# Patient Record
Sex: Female | Born: 1935 | Race: White | Hispanic: No | Smoking: Former smoker
Health system: Southern US, Community
[De-identification: ages and names within clinical notes are randomized; demographics above are authoritative.]

## PROBLEM LIST (undated history)

## (undated) DIAGNOSIS — E785 Hyperlipidemia, unspecified: Secondary | ICD-10-CM

## (undated) DIAGNOSIS — J449 Chronic obstructive pulmonary disease, unspecified: Secondary | ICD-10-CM

## (undated) DIAGNOSIS — E756 Lipid storage disorder, unspecified: Secondary | ICD-10-CM

## (undated) DIAGNOSIS — R42 Dizziness and giddiness: Secondary | ICD-10-CM

## (undated) DIAGNOSIS — E8809 Other disorders of plasma-protein metabolism, not elsewhere classified: Secondary | ICD-10-CM

## (undated) DIAGNOSIS — K219 Gastro-esophageal reflux disease without esophagitis: Secondary | ICD-10-CM

## (undated) DIAGNOSIS — N816 Rectocele: Secondary | ICD-10-CM

## (undated) DIAGNOSIS — I10 Essential (primary) hypertension: Secondary | ICD-10-CM

## (undated) HISTORY — PX: LAPAROSCOPIC TOTAL HYSTERECTOMY: SUR800

## (undated) HISTORY — DX: Lipid storage disorder, unspecified: E75.6

## (undated) HISTORY — DX: Gastro-esophageal reflux disease without esophagitis: K21.9

## (undated) HISTORY — PX: FRACTURE SURGERY: SHX138

## (undated) HISTORY — DX: Hyperlipidemia, unspecified: E78.5

## (undated) HISTORY — DX: Other disorders of plasma-protein metabolism, not elsewhere classified: E88.09

## (undated) HISTORY — PX: DILATION AND CURETTAGE OF UTERUS: SHX78

## (undated) HISTORY — PX: TIBIALIS TENDON TRANSFER / REPAIR: SHX6630

## (undated) HISTORY — PX: MOHS SURGERY: SUR867

## (undated) HISTORY — PX: CATARACT EXTRACTION, BILATERAL: SHX1313

## (undated) HISTORY — DX: Essential (primary) hypertension: I10

## (undated) HISTORY — DX: Rectocele: N81.6

## (undated) HISTORY — DX: Dizziness and giddiness: R42

## (undated) HISTORY — DX: Chronic obstructive pulmonary disease, unspecified: J44.9

---

## 2005-04-25 ENCOUNTER — Ambulatory Visit: Payer: Self-pay | Admitting: Internal Medicine

## 2005-05-09 ENCOUNTER — Encounter (INDEPENDENT_AMBULATORY_CARE_PROVIDER_SITE_OTHER): Payer: Self-pay | Admitting: Specialist

## 2005-05-09 ENCOUNTER — Ambulatory Visit: Payer: Self-pay | Admitting: Internal Medicine

## 2010-03-02 ENCOUNTER — Encounter: Payer: Self-pay | Admitting: Internal Medicine

## 2010-04-14 ENCOUNTER — Encounter (INDEPENDENT_AMBULATORY_CARE_PROVIDER_SITE_OTHER): Payer: Self-pay | Admitting: *Deleted

## 2010-04-15 ENCOUNTER — Ambulatory Visit: Payer: Self-pay | Admitting: Internal Medicine

## 2010-05-09 ENCOUNTER — Ambulatory Visit: Payer: Self-pay | Admitting: Internal Medicine

## 2010-05-11 ENCOUNTER — Encounter: Payer: Self-pay | Admitting: Internal Medicine

## 2010-09-13 NOTE — Letter (Signed)
Summary: [facility] Instructions  [facility]  [address] [place], [place] [address]   Phone: [phone]  Fax: [phone]       Katie Tran    September 29, 1933    MRN: [id number]        Procedure Day /Date:  Monday 05/09/2010     Arrival Time: 7:30 am      Procedure Time: 8:30 am     Location of Procedure:                    _ x_  [facility] (4th Floor)                        PREPARATION FOR COLONOSCOPY WITH MOVIPREP   Starting 5 days prior to your procedure Wednesday 9/21 do not eat nuts, seeds, popcorn, corn, beans, peas,  salads, or any raw vegetables.  Do not take any fiber supplements (e.g. Metamucil, Citrucel, and Benefiber).  THE DAY BEFORE YOUR PROCEDURE         DATE: Sunday 9/25  1.  Drink clear liquids the entire day-NO SOLID FOOD  2.  Do not drink anything colored red or purple.  Avoid juices with pulp.  No orange juice.  3.  Drink at least 64 oz. (8 glasses) of fluid/clear liquids during the day to prevent dehydration and help the prep work efficiently.  CLEAR LIQUIDS INCLUDE: Water Jello Ice Popsicles Tea (sugar ok, no milk/cream) Powdered fruit flavored drinks Coffee (sugar ok, no milk/cream) Gatorade Juice: apple, white grape, white cranberry  Lemonade Clear bullion, consomm, broth Carbonated beverages (any kind) Strained chicken noodle soup Hard Candy                             4.  In the morning, mix first dose of MoviPrep solution:    Empty 1 Pouch A and 1 Pouch B into the disposable container    Add lukewarm drinking water to the top line of the container. Mix to dissolve    Refrigerate (mixed solution should be used within 24 hrs)  5.  Begin drinking the prep at 5:00 p.m. The MoviPrep container is divided by 4 marks.   Every 15 minutes drink the solution down to the next mark (approximately 8 oz) until the full liter is complete.   6.  Follow completed prep with 16 oz of clear liquid of your choice  (Nothing red or purple).  Continue to drink clear liquids until bedtime.  7.  Before going to bed, mix second dose of MoviPrep solution:    Empty 1 Pouch A and 1 Pouch B into the disposable container    Add lukewarm drinking water to the top line of the container. Mix to dissolve    Refrigerate  THE DAY OF YOUR PROCEDURE      DATE: Monday 9/26  Beginning at 3:30 a.m. (5 hours before procedure):         1. Every 15 minutes, drink the solution down to the next mark (approx 8 oz) until the full liter is complete.  2. Follow completed prep with 16 oz. of clear liquid of your choice.    3. You may drink clear liquids until 6:30 am (2 HOURS BEFORE PROCEDURE).   MEDICATION INSTRUCTIONS  Unless otherwise instructed, you should take regular prescription medications with a small sip of water   as early as possible the morning of  your procedure.           OTHER INSTRUCTIONS  You will need a responsible adult at least 75 years of age to accompany you and drive you home.   This person must remain in the waiting room during your procedure.  Wear loose fitting clothing that is easily removed.  Leave jewelry and other valuables at home.  However, you may wish to bring a book to read or  an iPod/MP3 player to listen to music as you wait for your procedure to start.  Remove all body piercing jewelry and leave at home.  Total time from sign-in until discharge is approximately 2-3 hours.  You should go home directly after your procedure and rest.  You can resume normal activities the  day after your procedure.  The day of your procedure you should not:   Drive   Make legal decisions   Operate machinery   Drink alcohol   Return to work  You will receive specific instructions about eating, activities and medications before you leave.    The above instructions have been reviewed and explained to me by  Ezra Sites RN  April 15, 2010 1:01 PM      I fully understand  and can verbalize these instructions _____________________________ Date _________

## 2010-09-13 NOTE — Letter (Signed)
Summary: Patient Notice- Polyp Results  [facility]  [address] [place], [place] [address]   Phone: [phone]  Fax: [phone]        May 11, 2010 MRN: [id number]    Katie Tran [address] [place], [place]  [address]    Dear Ms. Breeding,  I am pleased to inform you that the colon polyp(s) removed during your recent colonoscopy was (were) found to be benign (no cancer detected) upon pathologic examination.  I recommend you have a repeat colonoscopy examination in 5 years to look for recurrent polyps, as having colon polyps increases your risk for having recurrent polyps or even colon cancer in the future.  Should you develop new or worsening symptoms of abdominal pain, bowel habit changes or bleeding from the rectum or bowels, please schedule an evaluation with either your primary care physician or with me.  Additional information/recommendations:  __ No further action with gastroenterology is needed at this time. Please      follow-up with your primary care physician for your other healthcare      needs.    Please call us if you are having persistent problems or have questions about your condition that have not been fully answered at this time.  Sincerely,  Hilarie Fredrickson MD  This letter has been electronically signed by your physician.  Appended Document: Patient Notice- Polyp Results letter mailed

## 2010-09-13 NOTE — Procedures (Signed)
Summary: Colonoscopy  Patient: Katie Tran Note: All result statuses are Final unless otherwise noted.  Tests: (1) Colonoscopy (COL)   COL Colonoscopy           DONE     [facility]     [address]     [place], [place]  [address]           COLONOSCOPY PROCEDURE REPORT           PATIENT:  Katie Tran, Katie Tran  MR#:  [id number]     BIRTHDATE:  [birth year], 76 yrs. old  GENDER:  female     ENDOSCOPIST:  Wilhemina Bonito. Eda Keys, MD     REF. BY:  Surveillance Program Recall     PROCEDURE DATE:  05/09/2010     PROCEDURE:  Colonoscopy with snare polypectomy x 2     ASA CLASS:  Class II     INDICATIONS:  history of pre-cancerous (adenomatous) colon polyps,     surveillance and high-risk screening ; prior exams 1997, 2000,     2006 w/ adenomas     MEDICATIONS:   Fentanyl 100 mcg IV, Versed 10 mg IV           DESCRIPTION OF PROCEDURE:   After the risks benefits and     alternatives of the procedure were thoroughly explained, informed     consent was obtained.  Digital rectal exam was performed and     revealed moderate external hemorrhoids.   The LB [id number]     PEDIATRIC endoscope was introduced through the anus and advanced     to the cecum, which was identified by both the appendix and     ileocecal valve, limited by diverticulosis, severe. Time to cecum     = 2:36 min.    The quality of the prep was excellent, using     MoviPrep.  The instrument was then slowly withdrawn (time = 13:14     min) as the colon was fully examined.     <<PROCEDUREIMAGES>>           FINDINGS:  Two polyps ( , ) were found in the cecum. Polyps     were snared without cautery. Retrieval was successful.   Severe     diverticulosis, WITH SIGMOID STENOSIS, was found throughout the     colon.   Retroflexed views in the rectum revealed internal     hemorrhoids.    The scope was then withdrawn from the patient and     the procedure completed.           COMPLICATIONS:  None     ENDOSCOPIC  IMPRESSION:     1) Two polyps in the cecum - Removed     2) Severe diverticulosis (with sigmoid STENOSIS) throughout the     colon     3) Internal hemorrhoids           RECOMMENDATIONS:     1) Follow up colonoscopy in 5 years           ______________________________     Wilhemina Bonito. Eda Keys, MD           CC:  The Patient,  Suzy Bouchard MD           n.     eSIGNED:   Wilhemina Bonito. Eda Keys at 05/09/2010 09:18 AM           Eustace Pen, [id number]  Note: An exclamation mark (!) indicates a result  that was not dispersed into the flowsheet. Document Creation Date: 05/09/2010 9:19 AM _______________________________________________________________________  (1) Order result status: Final Collection or observation date-time: 05/09/2010 09:08 Requested date-time:  Receipt date-time:  Reported date-time:  Referring Physician:   Ordering Physician: Fransico Setters [phone]) Specimen Source:  Source: Launa Grill Order Number: [id number] Lab site:   Appended Document: Colonoscopy     Procedures Next Due Date:    Colonoscopy: 04/2015

## 2010-09-13 NOTE — Letter (Signed)
Summary: Previsit letter  [facility]  [address] [place], [place] [address]   Phone: [phone]  Fax: [phone]       03/02/2010 MRN: [id number]  Katie Tran [address] [place], [place]  [address]  Dear Ms. [name],  Welcome to the Gastroenterology Division at [facility].    You are scheduled to see a nurse for your pre-procedure visit on 04-15-10 at 1pm on the 3rd floor at [facility], [address]  We ask that you try to arrive at our office 15 minutes prior to your appointment time to allow for check-in.  Your nurse visit will consist of discussing your medical and surgical history, your immediate family medical history, and your medications.    Please bring a complete list of all your medications or, if you prefer, bring the medication bottles and we will list them.  We will need to be aware of both prescribed and over the counter drugs.  We will need to know exact dosage information as well.  If you are on blood thinners (Coumadin, Plavix, Aggrenox, Ticlid, etc.) please call our office today/prior to your appointment, as we need to consult with your physician about holding your medication.   Please be prepared to read and sign documents such as consent forms, a financial agreement, and acknowledgement forms.  If necessary, and with your consent, a friend or relative is welcome to sit-in on the nurse visit with you.  Please bring your insurance card so that we may make a copy of it.  If your insurance requires a referral to see a specialist, please bring your referral form from your primary care physician.  No co-pay is required for this nurse visit.     If you cannot keep your appointment, please call [phone] to cancel or reschedule prior to your appointment date.  This allows [place] the opportunity to schedule an appointment for another patient in need of care.    Thank you for choosing [facility] for your medical needs.   We appreciate the opportunity to care for you.  Please visit [place] at our website  to learn more about our practice.                     Sincerely.                                                                                                                   The Gastroenterology Division

## 2010-09-13 NOTE — Miscellaneous (Signed)
Summary: [facility]  Clinical Lists Changes  Medications: Added new medication of MOVIPREP 100 GM  SOLR (PEG-KCL-NACL-NASULF-NA ASC-C) As per prep instructions. - Signed Rx of MOVIPREP 100 GM  SOLR (PEG-KCL-NACL-NASULF-NA ASC-C) As per prep instructions.;  #1 x 0;  Signed;  Entered by: Ezra Sites RN;  Authorized by: Hilarie Fredrickson MD;  Method used: Electronically to [facility] [address] [place], [place]  [address], Ph: [phone], Fax: [phone] Allergies: Added new allergy or adverse reaction of PCN Observations: Added new observation of NKA: F (04/15/2010 12:38)    Prescriptions: MOVIPREP 100 GM  SOLR (PEG-KCL-NACL-NASULF-NA ASC-C) As per prep instructions.  #1 x 0   Entered by:   Ezra Sites RN   Authorized by:   Hilarie Fredrickson MD   Signed by:   Ezra Sites RN on 04/15/2010   Method used:   Electronically to        [facility] (retail)       [address]       [place], [place]  [address]       Ph: [phone]       Fax: [phone]   RxID:   [id number]

## 2011-05-15 ENCOUNTER — Other Ambulatory Visit: Payer: Self-pay | Admitting: Dermatology

## 2011-07-05 ENCOUNTER — Other Ambulatory Visit: Payer: Self-pay | Admitting: Dermatology

## 2015-02-04 ENCOUNTER — Encounter: Payer: Self-pay | Admitting: Internal Medicine

## 2015-02-12 ENCOUNTER — Encounter: Payer: Self-pay | Admitting: Internal Medicine

## 2015-04-12 ENCOUNTER — Ambulatory Visit (AMBULATORY_SURGERY_CENTER): Payer: Self-pay

## 2015-04-12 VITALS — Ht 64.5 in | Wt 190.0 lb

## 2015-04-12 DIAGNOSIS — Z860100 Personal history of colon polyps, unspecified: Secondary | ICD-10-CM

## 2015-04-12 MED ORDER — MOVIPREP 100 G PO SOLR: 1.0000 | Freq: Once | ORAL | Status: DC

## 2015-04-12 NOTE — Progress Notes (Signed)
No allergies to eggs or soy No home oxygen No diet/weight loss meds No past problems with anesthesia  Has email  Emmi instruction given for colonoscopy

## 2015-04-26 ENCOUNTER — Ambulatory Visit (AMBULATORY_SURGERY_CENTER): Payer: Medicare Other | Admitting: Internal Medicine

## 2015-04-26 ENCOUNTER — Encounter: Payer: Self-pay | Admitting: Internal Medicine

## 2015-04-26 VITALS — BP 148/81 | HR 74 | Temp 97.4°F | Resp 18 | Ht 64.5 in | Wt 190.0 lb

## 2015-04-26 DIAGNOSIS — D123 Benign neoplasm of transverse colon: Secondary | ICD-10-CM

## 2015-04-26 DIAGNOSIS — D122 Benign neoplasm of ascending colon: Secondary | ICD-10-CM

## 2015-04-26 DIAGNOSIS — Z86010 Personal history of colonic polyps: Secondary | ICD-10-CM

## 2015-04-26 MED ORDER — SODIUM CHLORIDE 0.9 % IV SOLN: 500.0000 mL | INTRAVENOUS | Status: DC

## 2015-04-26 NOTE — Patient Instructions (Signed)
Impressions/recommendations:  Polyps (handout given) Diverticulosis (handout given) High Fiber diet (handout given)  YOU HAD AN ENDOSCOPIC PROCEDURE TODAY AT THE [facility]:   Refer to the procedure report that was given to you for any specific questions about what was found during the examination.  If the procedure report does not answer your questions, please call your gastroenterologist to clarify.  If you requested that your care partner not be given the details of your procedure findings, then the procedure report has been included in a sealed envelope for you to review at your convenience later.  YOU SHOULD EXPECT: Some feelings of bloating in the abdomen. Passage of more gas than usual.  Walking can help get rid of the air that was put into your GI tract during the procedure and reduce the bloating. If you had a lower endoscopy (such as a colonoscopy or flexible sigmoidoscopy) you may notice spotting of blood in your stool or on the toilet paper. If you underwent a bowel prep for your procedure, you may not have a normal bowel movement for a few days.  Please Note:  You might notice some irritation and congestion in your nose or some drainage.  This is from the oxygen used during your procedure.  There is no need for concern and it should clear up in a day or so.  SYMPTOMS TO REPORT IMMEDIATELY:   Following lower endoscopy (colonoscopy or flexible sigmoidoscopy):  Excessive amounts of blood in the stool  Significant tenderness or worsening of abdominal pains  Swelling of the abdomen that is new, acute  Fever of 100F or higher  For urgent or emergent issues, a gastroenterologist can be reached at any hour by calling [phone].   DIET: Your first meal following the procedure should be a small meal and then it is ok to progress to your normal diet. Heavy or fried foods are harder to digest and may make you feel nauseous or bloated.  Likewise, meals heavy in dairy and  vegetables can increase bloating.  Drink plenty of fluids but you should avoid alcoholic beverages for 24 hours.  ACTIVITY:  You should plan to take it easy for the rest of today and you should NOT DRIVE or use heavy machinery until tomorrow (because of the sedation medicines used during the test).    FOLLOW UP: Our staff will call the number listed on your records the next business day following your procedure to check on you and address any questions or concerns that you may have regarding the information given to you following your procedure. If we do not reach you, we will leave a message.  However, if you are feeling well and you are not experiencing any problems, there is no need to return our call.  We will assume that you have returned to your regular daily activities without incident.  If any biopsies were taken you will be contacted by phone or by letter within the next 1-3 weeks.  Please call us at [phone] if you have not heard about the biopsies in 3 weeks.    SIGNATURES/CONFIDENTIALITY: You and/or your care partner have signed paperwork which will be entered into your electronic medical record.  These signatures attest to the fact that that the information above on your After Visit Summary has been reviewed and is understood.  Full responsibility of the confidentiality of this discharge information lies with you and/or your care-partner. 

## 2015-04-26 NOTE — Op Note (Signed)
[facility]  [address] [place], [address]   COLONOSCOPY PROCEDURE REPORT  PATIENT: Lonita, Debes  MR#: [id number] BIRTHDATE: Nov 20, 1933 , 81  yrs. old GENDER: female ENDOSCOPIST: Eustace Quail, MD REFERRED [id number] Program Recall PROCEDURE DATE:  04/26/2015 PROCEDURE:   Colonoscopy, surveillance and Colonoscopy with snare polypectomy x 3 First Screening Colonoscopy - Avg.  risk and is 50 yrs.  old or older - No.  Prior Negative Screening - Now for repeat screening. N/A  History of Adenoma - Now for follow-up colonoscopy & has been > or = to 3 yrs.  Yes hx of adenoma.  Has been 3 or more years since last colonoscopy.  Polyps removed today? Yes ASA CLASS:   Class II INDICATIONS:Surveillance due to prior colonic neoplasia and PH Colon Adenoma. . Previous examinations 1997, 2000, 2006, 2011 with tubular adenomas MEDICATIONS: Monitored anesthesia care and Propofol 230 mg IV  DESCRIPTION OF PROCEDURE:   After the risks benefits and alternatives of the procedure were thoroughly explained, informed consent was obtained.  The digital rectal exam revealed no abnormalities of the rectum.   The LB [id number]  endoscope was introduced through the anus and advanced to the cecum, which was identified by both the appendix and ileocecal valve. No adverse events experienced.   The quality of the prep was excellent. (MoviPrep was used)  The instrument was then slowly withdrawn as the colon was fully examined. Estimated blood loss is zero unless otherwise noted in this procedure report.  COLON FINDINGS: Three polyps measuring 2 mm in size were found in the transverse colon and ascending colon.  A polypectomy was performed with a cold snare.  The resection was complete, the polyp tissue was completely retrieved and sent to histology.   There was severe diverticulosis noted throughout the entire examined colon. The examination was otherwise normal.   Retroflexed views revealed internal hemorrhoids. The time to cecum = 6.5 Withdrawal time = 11.5   The scope was withdrawn and the procedure completed. COMPLICATIONS: There were no immediate complications.  ENDOSCOPIC IMPRESSION: 1.   Three polyps were found in the transverse colon and ascending colon; polypectomy was performed with a cold snare 2.   Severe diverticulosis was noted throughout the entire examined colon 3.   The examination was otherwise normal  RECOMMENDATIONS: 1. Return to the care of your primary provider.  GI follow up as needed  eSigned:  Eustace Quail, MD 04/26/2015 10:10 AM   cc: The Patient and Darlina Sicilian MD

## 2015-04-26 NOTE — Progress Notes (Signed)
Called to room to assist during endoscopic procedure.  Patient ID and intended procedure confirmed with present staff. Received instructions for my participation in the procedure from the performing physician.  

## 2015-04-26 NOTE — Progress Notes (Signed)
To recovery, report to Ennis, RN, VSS 

## 2015-04-27 ENCOUNTER — Telehealth: Payer: Self-pay | Admitting: *Deleted

## 2015-04-27 NOTE — Telephone Encounter (Signed)
  Follow up Call-  Call back number 04/26/2015  Post procedure Call Back phone  # [phone]  Permission to leave phone message Yes     Patient questions:  Do you have a fever, pain , or abdominal swelling? No. Pain Score  0 *  Have you tolerated food without any problems? Yes.    Have you been able to return to your normal activities? Yes.    Do you have any questions about your discharge instructions: Diet   No. Medications  No. Follow up visit  No.  Do you have questions or concerns about your Care? No.  Actions: * If pain score is 4 or above: No action needed, pain <4.

## 2015-05-04 ENCOUNTER — Encounter: Payer: Self-pay | Admitting: Internal Medicine

## 2016-06-26 DIAGNOSIS — R911 Solitary pulmonary nodule: Secondary | ICD-10-CM | POA: Insufficient documentation

## 2016-11-22 DIAGNOSIS — Z87442 Personal history of urinary calculi: Secondary | ICD-10-CM | POA: Insufficient documentation

## 2016-11-22 DIAGNOSIS — N23 Unspecified renal colic: Secondary | ICD-10-CM | POA: Insufficient documentation

## 2016-12-21 DIAGNOSIS — N8500 Endometrial hyperplasia, unspecified: Secondary | ICD-10-CM | POA: Insufficient documentation

## 2017-01-22 DIAGNOSIS — N3941 Urge incontinence: Secondary | ICD-10-CM | POA: Insufficient documentation

## 2020-10-06 ENCOUNTER — Encounter: Payer: Self-pay | Admitting: Gastroenterology

## 2020-10-06 ENCOUNTER — Telehealth: Payer: Self-pay | Admitting: Gastroenterology

## 2020-10-06 ENCOUNTER — Ambulatory Visit (INDEPENDENT_AMBULATORY_CARE_PROVIDER_SITE_OTHER): Payer: Medicare Other | Admitting: Gastroenterology

## 2020-10-06 ENCOUNTER — Other Ambulatory Visit: Payer: Self-pay

## 2020-10-06 VITALS — BP 138/90 | HR 79 | Ht 64.5 in | Wt 170.0 lb

## 2020-10-06 DIAGNOSIS — K5909 Other constipation: Secondary | ICD-10-CM

## 2020-10-06 DIAGNOSIS — R933 Abnormal findings on diagnostic imaging of other parts of digestive tract: Secondary | ICD-10-CM | POA: Insufficient documentation

## 2020-10-06 NOTE — Telephone Encounter (Signed)
Katie Tran from [facility] office states they do not have the prep kit for a barium enema, this needs to be ordered at a pharmacy.

## 2020-10-06 NOTE — Progress Notes (Signed)
10/06/2020 Katie Tran [id number] Jan 04, 1934   HISTORY OF PRESENT ILLNESS:  This is an 87 year old female who is a patient of Dr. Blanch Media.  She had a colonoscopy in 04/2015 that showed severe diverticulosis and a few polyps that were removed and were tubular adenomas on pathology.  No repeat colonoscopy was recommended due to age. She is here today for follow-up regarding an abnormal CT scan. She tells me that she was having some issues with UTIs and followed up with her urologist or [facility]. She ended up being treated with a 5-day course of Cipro and then some Macrobid. During all that she ended up having a CT scan of the abdomen and pelvis without contrast through [facility].  It showed diverticulosis and mild circumferential thickening of the sigmoid colon with vasa recta prominence is favored to represent sequelae of chronic diverticulitis.  There was also questionable subtle stranding adjacent to the sigmoid diverticulum which may be artifactual in nature versus mild acute diverticulitis. She was told to follow-up with her gastroenterologist regarding these findings. She is not currently having any GI issues. She does report intermittent periumbilical abdominal pain that is associated with a knot or lump. CT scan did report umbilical hernia and ventral hernia.   Past Medical History:  Diagnosis Date  . 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 COPD (chronic obstructive pulmonary disease) ([place])   . GERD (gastroesophageal reflux disease)   . Hyperlipidemia   . Rectocele   . Vertigo    Past Surgical History:  Procedure Laterality Date  . CATARACT EXTRACTION, BILATERAL    . DILATION AND CURETTAGE OF UTERUS    . FRACTURE SURGERY     ankle  . LAPAROSCOPIC TOTAL HYSTERECTOMY    . MOHS SURGERY     x2  . TIBIALIS TENDON TRANSFER / REPAIR     posterior    reports that she has quit smoking. She has never used smokeless tobacco. She reports current  alcohol use of about 2.0 standard drinks of alcohol per week. She reports that she does not use drugs. family history includes Colon polyps in her mother. Allergies  Allergen Reactions  . Penicillins     REACTION: rash  . Epinephrine Palpitations    tachycardia      Outpatient Encounter Medications as of 10/06/2020  Medication Sig  . aspirin 81 MG tablet Take 81 mg by mouth daily.  Marland Kitchen atorvastatin (LIPITOR) 10 MG tablet Take 10 mg by mouth daily.  . Ergocalciferol (VITAMIN D2) 10 MCG (400 UNIT) TABS Take 1 tablet by mouth daily.  Marland Kitchen esomeprazole (NEXIUM) 40 MG capsule Take 40 mg by mouth daily at 12 noon.  . meclizine (ANTIVERT) 25 MG tablet Take 25 mg by mouth 3 (three) times daily as needed for dizziness.  . Multiple Vitamin (MULTIVITAMIN ADULT PO) Take 1 tablet by mouth daily.  Marland Kitchen saccharomyces boulardii (FLORASTOR) 250 MG capsule Take 250 mg by mouth 2 (two) times daily.  . Zinc 30 MG CAPS Take 1 capsule by mouth daily.   No facility-administered encounter medications on file as of 10/06/2020.     REVIEW OF SYSTEMS  : All other systems reviewed and negative except where noted in the History of Present Illness.   PHYSICAL EXAM: BP 138/90   Pulse 79   Ht 5' 4.5" (1.638 m)   Wt 170 lb (77.1 kg)   BMI 28.73 kg/m  General: Well developed white female  in no acute distress Head: Normocephalic and atraumatic Eyes:  Sclerae anicteric, conjunctiva pink. Ears: Normal auditory acuity Lungs: Clear throughout to auscultation; no W/R/R. Heart: Regular rate and rhythm; no M/R/G. Abdomen: Soft, non-distended.  BS present.  Non-tender.  No hernias appreciated on today's exam. Musculoskeletal: Symmetrical with no gross deformities  Skin: No lesions on visible extremities Extremities: No edema  Neurological: Alert oriented x 4, grossly non-focal Psychological:  Alert and cooperative. Normal mood and affect  ASSESSMENT AND PLAN: *Abnormal imaging of the colon on CT scan without contrast  earlier this month.  It showed diverticulosis and mild circumferential thickening of the sigmoid colon with vasa recta prominence is favored to represent sequelae of chronic diverticulitis.  There was also questionable subtle stranding adjacent to the sigmoid diverticulum which may be artifactual in nature versus mild acute diverticulitis.  She does not have any symptoms at this time consistent with diverticulitis and is not tender on exam.  She would like some type of evaluation/follow-up of these findings.  Her last colonoscopy in September 2016 showed severe diverticulosis and 3 adenomatous polyps removed.  Due to her age would not proceed with follow-up colonoscopy.  We will start with barium enema. *Constipation:  Will begin Miralax daily.   CC:  [name], MD

## 2020-10-06 NOTE — Progress Notes (Signed)
Assessment and plan reviewed.  Agree with contrast study as the next step

## 2020-10-06 NOTE — Telephone Encounter (Signed)
Instructions sent via mychart and email.   Barium Enema Prep  Please purchase the following items from the laxative section of your drug store 10 oz.  Magnesium Citrate Bottle 3 Bisacodyl Tablets-Enteric coated 1 Bisacodyl Suppository   Day before exam  12:00 Lunch. This meal may include clear broth, white chicken meat sandwich (no butter, lettuce or other additives), or two hard boiled eggs, strained fruit juices, jello or gelatin ( not containing fruits or nuts).  Coffee or tea (without milk or cream), carbonated beverages.  1:00 pm Drink one full glass or more of water   3:00 pm Drink one full glass or more of water  5:00 pm  Supper.  It is very important that supper be limited to the items described here Broth, Clear jello/gelatin with no whole foods added (no red), soft drinks, gatorade, water black coffee or tea (no milk or cream), popsicles.   No red jello or popsicles.   7:00 pm drink one full glass of water  8:00 pm Drink the bottle of Magnesium Citrate (drink cold if preferred)  10:00 pm  Take three (3) Bisacodyl tablets with one full glass or more of water.   Day of exam  NO breakfast, you may have coffee, tea (without milk or cream), or strained fruit juice 7:00 am unwrap the foil wrapper from bisacodyl suppository and discard the foil.  While lying on your side with thigh elevated, insert the suppository into the rectum and gently push in as far as possible.  Retain the suppository for at least 15 minutes, if possible, before evacuating, even if the urge is strong. Bowel evacuation usually occurs within 15 to 60 minutes.  Patients requiring assistance should have a bed pan, commode or help readily available.

## 2020-10-06 NOTE — Patient Instructions (Addendum)
You have been scheduled for a Barium Enema at [facility] 1st floor  Radiology. Your appointment is scheduled for 10/14/20 at 9:30am. Please arrive 15 minutes prior to your appointment time for registration.  Please make certain to go to [facility]  Radiology after leaving our office today as you will need to pick up and go over a special prep for the test.  Should you have specific questions about the prep or test or if you should need to reschedule or cancel your test, please call radiology scheduling directly at [phone].  START: Miralax- dissolve 17gram( 1 capful ) in at least 8 ounces of water/juice daily.   If you are age 7 or older, your body mass index should be between 23-30. Your Body mass index is 28.73 kg/m. If this is out of the aforementioned range listed, please consider follow up with your Primary Care Provider.  Thank you for choosing me and [facility].  Alonza Bogus PA-C

## 2020-10-08 ENCOUNTER — Telehealth: Payer: Self-pay | Admitting: Gastroenterology

## 2020-10-08 NOTE — Telephone Encounter (Signed)
Instructions sent to patient via my chart and email through the scanner from fax. Called and left detailed message for pt.

## 2020-10-14 ENCOUNTER — Ambulatory Visit (HOSPITAL_COMMUNITY)
Admission: RE | Admit: 2020-10-14 | Discharge: 2020-10-14 | Disposition: A | Discharge status: Home / Self Care | Payer: Medicare Other | Source: Ambulatory Visit | Attending: Gastroenterology | Admitting: Gastroenterology

## 2020-10-14 ENCOUNTER — Other Ambulatory Visit: Payer: Self-pay | Admitting: Gastroenterology

## 2020-10-14 ENCOUNTER — Other Ambulatory Visit: Payer: Self-pay

## 2020-10-14 DIAGNOSIS — R933 Abnormal findings on diagnostic imaging of other parts of digestive tract: Secondary | ICD-10-CM | POA: Diagnosis not present

## 2020-11-02 ENCOUNTER — Ambulatory Visit: Payer: Medicare Other | Admitting: Internal Medicine

## 2021-09-26 DIAGNOSIS — K219 Gastro-esophageal reflux disease without esophagitis: Secondary | ICD-10-CM | POA: Insufficient documentation

## 2021-09-26 DIAGNOSIS — M1990 Unspecified osteoarthritis, unspecified site: Secondary | ICD-10-CM | POA: Insufficient documentation

## 2021-09-26 DIAGNOSIS — E785 Hyperlipidemia, unspecified: Secondary | ICD-10-CM | POA: Insufficient documentation

## 2021-09-26 DIAGNOSIS — E559 Vitamin D deficiency, unspecified: Secondary | ICD-10-CM | POA: Insufficient documentation

## 2022-08-22 IMAGING — DX DG BE SINGLE CONTRAST
13 of 17 series · 17 of 24 positions shown · non-contrast
Comparison: None.

CLINICAL DATA: Diverticulosis. Prior colon polyps. Abdominal pain.

EXAM:
SINGLE CONTRAST BARIUM ENEMA
TECHNIQUE: Initial scout AP supine abdominal image obtained to insure adequate
colon cleansing. Barium was introduced into the colon in a
retrograde fashion and refluxed from the rectum to the cecum. Spot
images of the colon followed by overhead radiographs were obtained.
FLUOROSCOPY TIME:  Fluoroscopy Time:  5 minutes, 18 seconds
Radiation Exposure Index (if provided by the fluoroscopic device):
172 mGy
Number of Acquired Spot Images: N/A

[t abdomen supine]
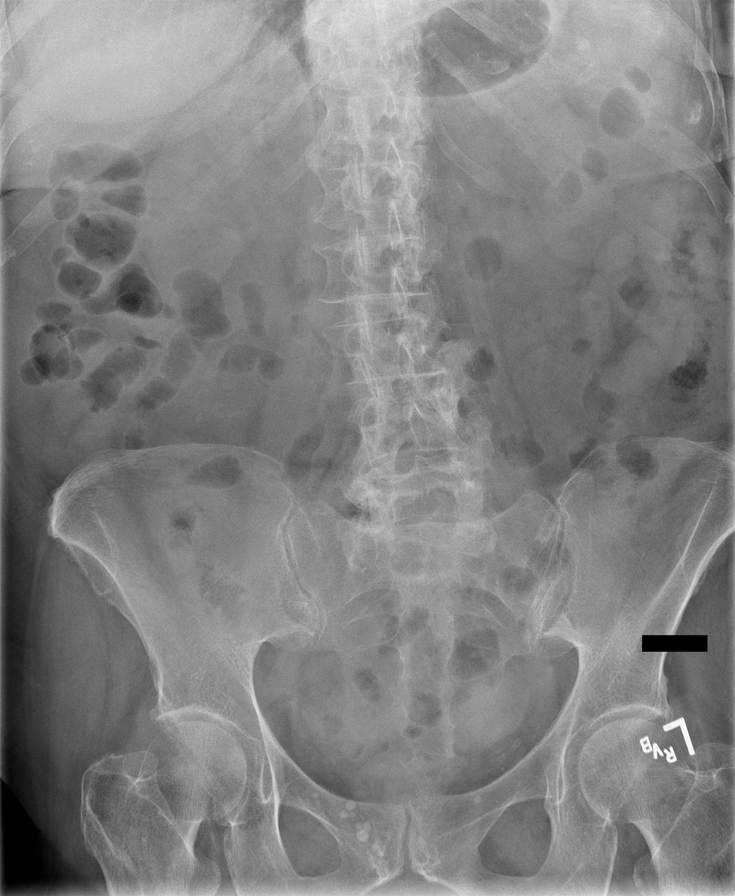

[cp_standard (1 of 10) · tomo slice 8/9.0]
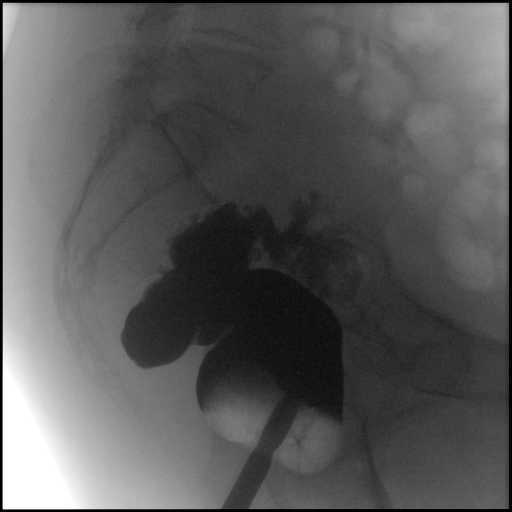

[cp_standard (2 of 10) · tomo slice 7/11.0]
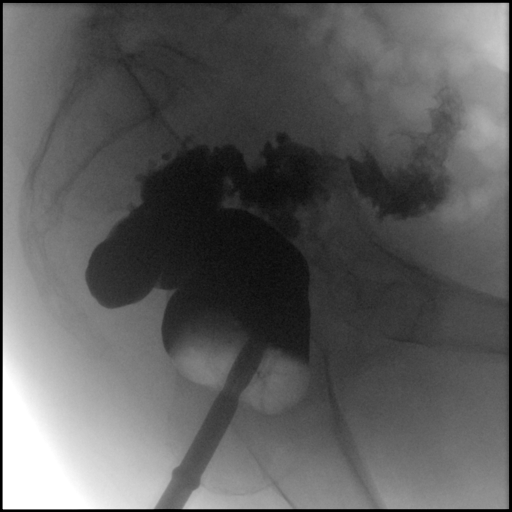

[fluoro_barium singleshot_bw]
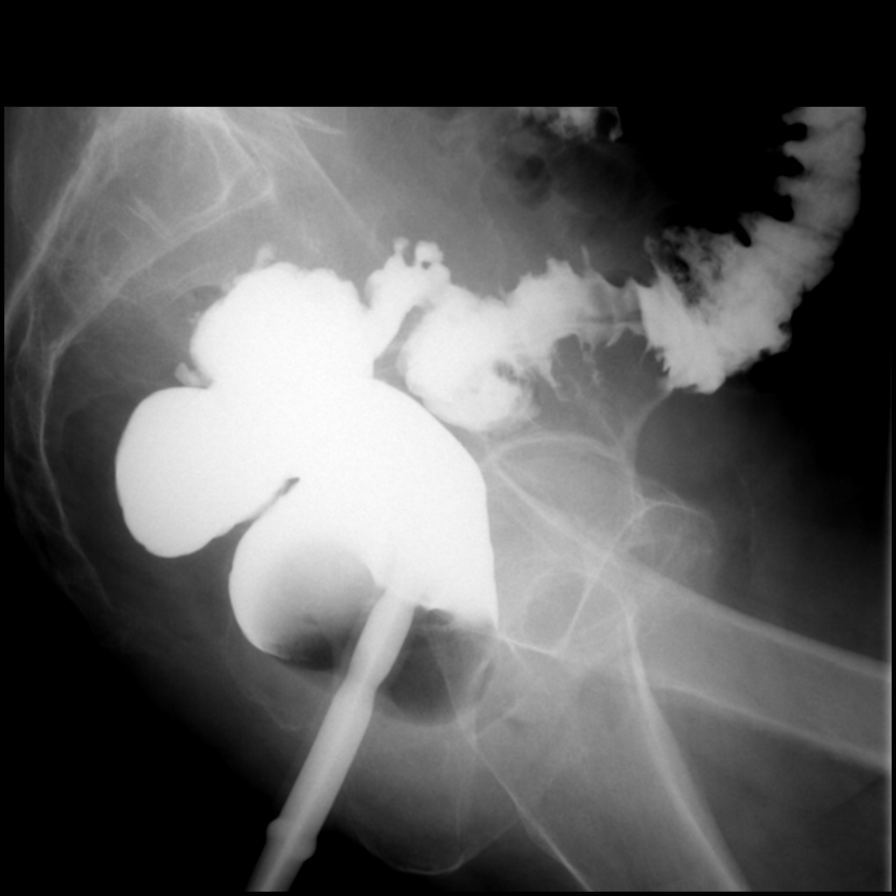

[Series 8: cp_standard · 0.35mm/px · 2 of 9 frames shown (3 of 10)]
[frame 2/9]
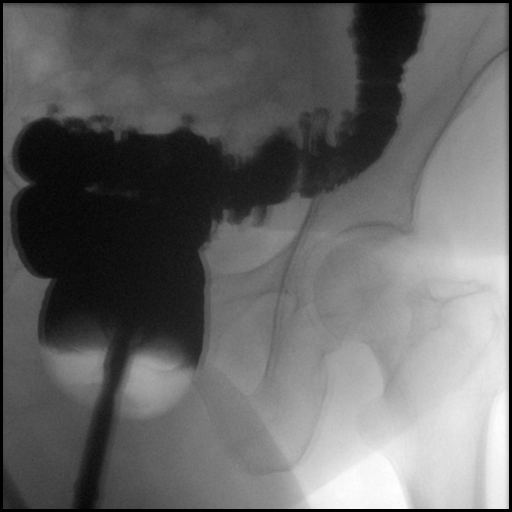
[frame 5/9]
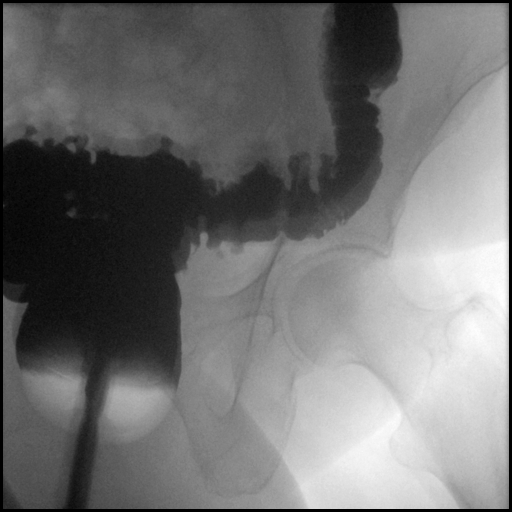

[cp_standard (4 of 10) · tomo slice 22/25.0]
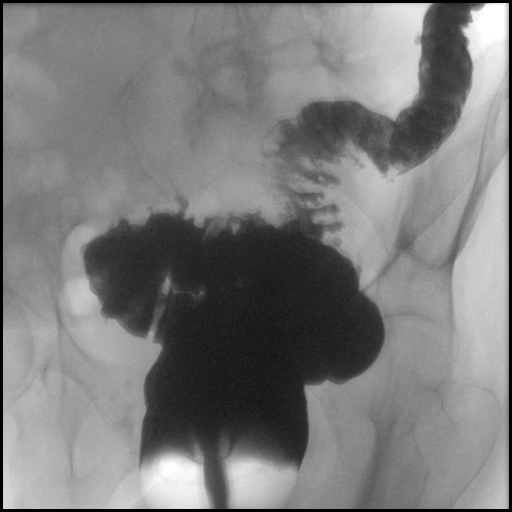

[cp_standard (5 of 10) · tomo slice 9/54.0]
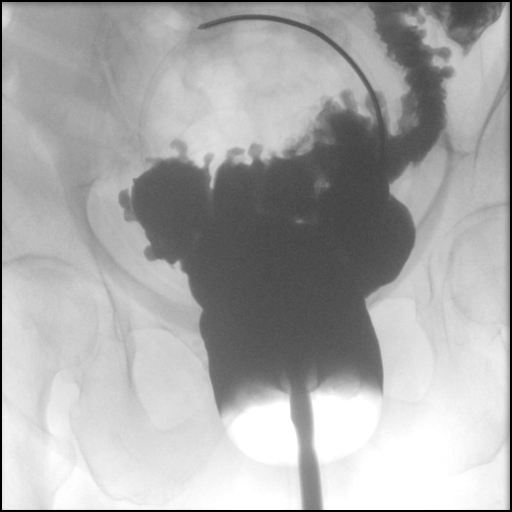

[cp_standard (6 of 10) · tomo slice 39/76.0]
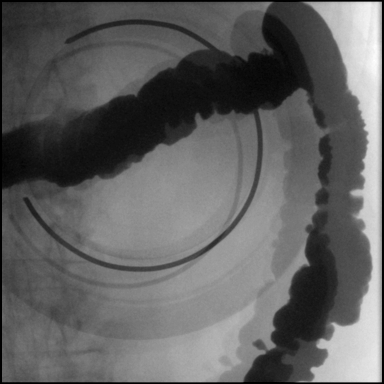

[Series 14: cp_standard · 0.35mm/px · 2 of 74 frames shown (7 of 10)]
[frame 12/74]
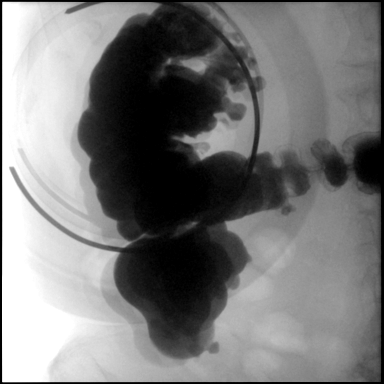
[frame 69/74]
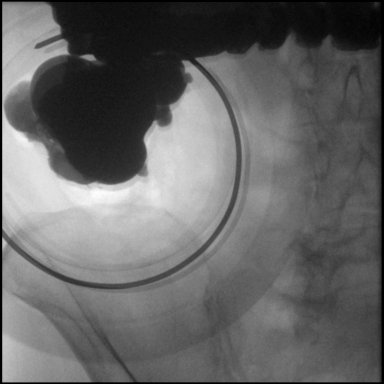

[Series 16: cp_standard · 0.36mm/px · 2 of 30 frames shown (8 of 10)]
[frame 5/30]
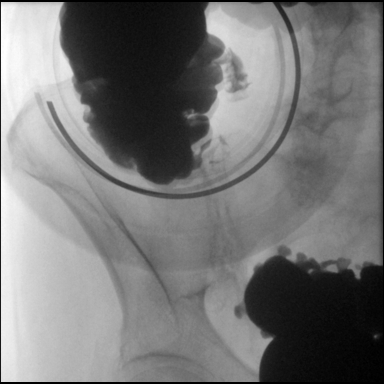
[frame 16/30]
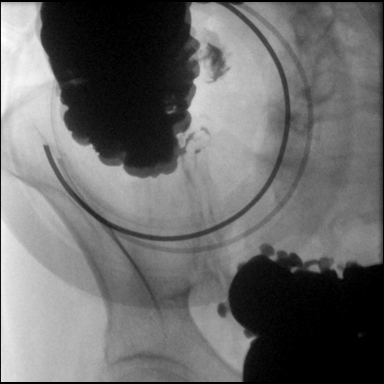

[Series 18: cp_standard · 0.35mm/px · 2 of 37 frames shown (9 of 10)]
[frame 19/37]
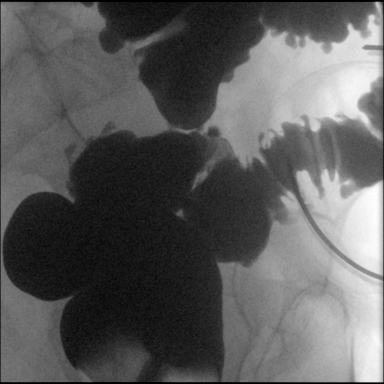
[frame 32/37]
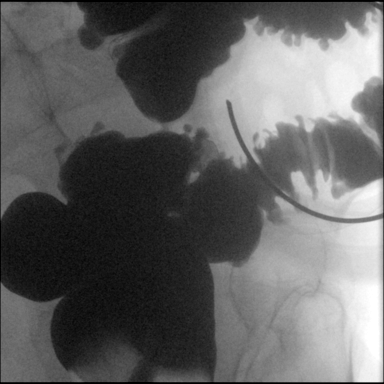

[cp_standard (10 of 10) · tomo slice 39/45.0]
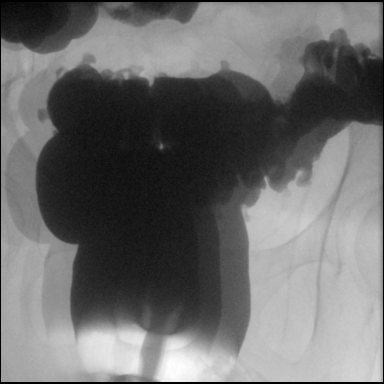

[t abdomen barium]
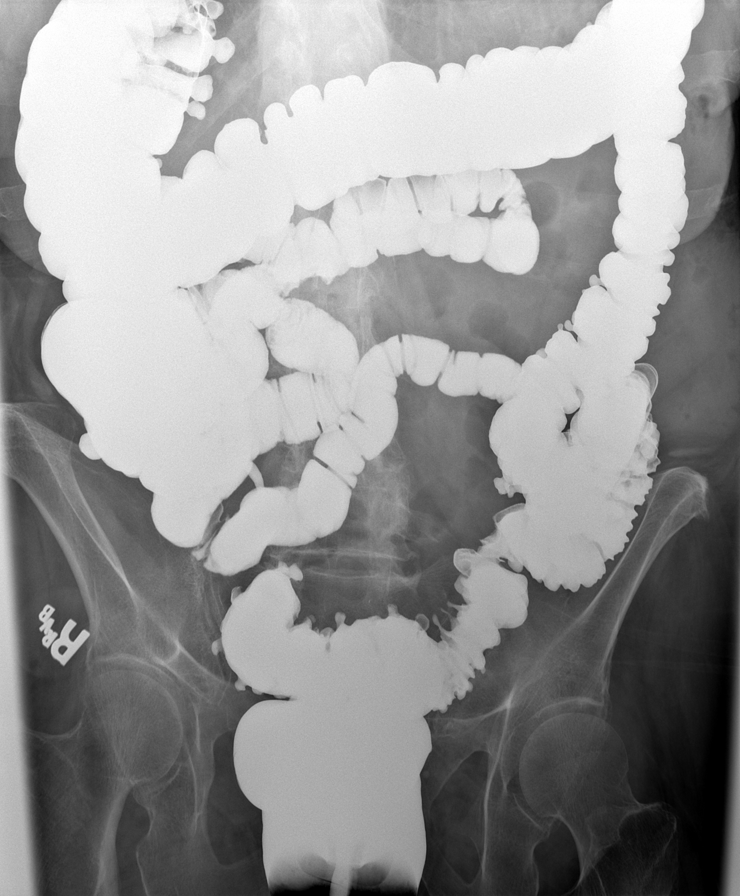

[17 of 24 positions shown; findings below may reference images not displayed]

FINDINGS: In discussion with the patient, she does have some incontinence, and
age 86 I was skeptical that she would be able to hold the pressure
of the double contrast enema. Accordingly, single contrast enema was
performed.

Initial KUB demonstrates normal bowel gas pattern and moderate lower
lumbar spondylosis.

A series of spot images and fluoroscopic series in addition to
overhead views of the colon were obtained during and after low
gravity barium infusion.

There is extensive sigmoid colon diverticulosis resulting in some
mild narrowing in the sigmoid colon likely attributable to the
associated muscular hypertrophy. There is some early spasm in the
sigmoid colon which improved later in the examination. I had the
patient shift into different obliquities in order to show the
slightly redundant pelvic loops of sigmoid colon as best as
possible.

Compression fluoroscopy was utilized and demonstrates some gas
bubbles and minimal residual amount of stool, but no substantial
nonmobile filling defect was identified to suggest mass or larger (1
cm or greater) polyp. We did reflux the terminal ileum and fill the
appendix.

There is scattered diverticula in the descending colon and scant
diverticula elsewhere in the colon, for example clustered along the
hepatic flexure. The refluxed loops of terminal ileum appear normal.

Mild lobularity along the lower rectum after removal of the catheter
probably attributable to internal hemorrhoids of the lower rectum.
IMPRESSION: 1. Colonic diverticulosis most striking in the sigmoid colon region.
2. No mass or discrete polyp is identified; single contrast barium
enema has a sensitivity of about 94% for polyps 10 mm or larger.

## 2022-10-31 ENCOUNTER — Encounter: Payer: Self-pay | Admitting: Obstetrics and Gynecology

## 2022-10-31 ENCOUNTER — Ambulatory Visit (INDEPENDENT_AMBULATORY_CARE_PROVIDER_SITE_OTHER): Payer: Medicare Other | Admitting: Obstetrics and Gynecology

## 2022-10-31 VITALS — BP 193/82 | HR 82 | Ht 64.45 in | Wt 170.0 lb

## 2022-10-31 DIAGNOSIS — N3281 Overactive bladder: Secondary | ICD-10-CM

## 2022-10-31 DIAGNOSIS — R350 Frequency of micturition: Secondary | ICD-10-CM

## 2022-10-31 DIAGNOSIS — N8110 Cystocele, unspecified: Secondary | ICD-10-CM

## 2022-10-31 DIAGNOSIS — N816 Rectocele: Secondary | ICD-10-CM

## 2022-10-31 DIAGNOSIS — N993 Prolapse of vaginal vault after hysterectomy: Secondary | ICD-10-CM

## 2022-10-31 DIAGNOSIS — N393 Stress incontinence (female) (male): Secondary | ICD-10-CM | POA: Diagnosis not present

## 2022-10-31 LAB — POCT URINALYSIS DIPSTICK
Bilirubin, UA: NEGATIVE
Blood, UA: NEGATIVE
Glucose, UA: NEGATIVE
Ketones, UA: NEGATIVE
Leukocytes, UA: NEGATIVE
Nitrite, UA: NEGATIVE
Protein, UA: NEGATIVE
Spec Grav, UA: 1.02 (ref 1.010–1.025)
Urobilinogen, UA: 0.2 E.U./dL
pH, UA: 7 (ref 5.0–8.0)

## 2022-10-31 NOTE — Progress Notes (Signed)
[name] New Patient Evaluation and Consultation  Referring Provider: [name], MD PCP: [name], MD Date of Service: 10/31/2022  SUBJECTIVE Chief Complaint: New Patient (Initial Visit) Katie Tran is a 89 y.o. female here for a rectocele consult. Pt said she sees a physician in [facility] for recurrent UTI's.)  History of Present Illness: Katie Tran is a 89 y.o. White or Caucasian female presenting for evaluation of recurrent UTI and incontinence.    Review of records significant for: Previously seen by [name] for recurrent UTI and kidney stones. Was placed on vaginal estrogen, cranberry/ d-mannose and keflex prophylaxis. Was placed on Gemtesa which she stopped taking. Has history of non-obstructive right renal stone.   Urine cultures in last year [facility]): 05/22/22- 50-100,000 E.Coli, , resistant to ampicillin, cipro, levofloxacin 09/26/21- >100, 000 E. Coli, resistant to ampicillin, cipro, levofloxacin  Has also seen [name]. 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 Has to d/c Myrbetriq due to hypertnesion. Urodynamics demonstrated overactive bladder. Noted to have stage III posterior vaginal prolapse.   Urinary Symptoms: Leaks urine with cough/ sneeze, going from sitting to standing, with a full bladder, with movement to the bathroom, with urgency, and while asleep Leaks 5 time(s) per day. UUI > SUI. Leaks with running water, "turn key and pee" Pad use: 5-6 adult diapers per day.   She is bothered by her UI symptoms. None of the medications did anything for her symptoms.   Day time voids: "many"- tries to limit water intake (voided 3 times in the 30 min she was sitting in the room).  Nocturia: 2-3 times per night to void. Voiding dysfunction: she does not empty her bladder well.  does not use a catheter to empty bladder.  When urinating, she feels she has no difficulties Drinks: water (how much depends on if she is home), 1 diet dr pepper or  gingerale per day  UTIs: 5-6 UTI's in the last year.   Currently on Keflex prophyalaxis.  Reports history of kidney or bladder stones  Pelvic Organ Prolapse Symptoms:                  She Admits to a feeling of a bulge the vaginal area. It has been present for 4 years.  She Admits to seeing a bulge.  This bulge is bothersome.   Bowel Symptom: Bowel movements: 5 time(s) per week Stool consistency: hard Straining: yes.  Splinting: yes.  Incomplete evacuation: yes.  She Denies accidental bowel leakage / fecal incontinence Bowel regimen: miralax- but cannot go out because she never knows when she is going to have a bowel movement after.  She has used a fiber supplement in the past but not recently.   Sexual Function Sexually active: no.   Pelvic Pain Admits to pelvic pain Location: left lower groin Pain occurs: when she is constipated Prior pain treatment: none Improved by: bowel movement     Past Medical History:  Past Medical History:  Diagnosis Date   Alpha galactosidase deficiency    Patient cannot ingest dairy or meat with 4 legs   COPD (chronic obstructive pulmonary disease) (HCC)    GERD (gastroesophageal reflux disease)    Hyperlipidemia    Hypertension    Rectocele    Vertigo      Past Surgical History:   Past Surgical History:  Procedure Laterality Date   CATARACT EXTRACTION, BILATERAL     DILATION AND CURETTAGE OF UTERUS     FRACTURE SURGERY  ankle   LAPAROSCOPIC TOTAL HYSTERECTOMY     MOHS SURGERY     x2   TIBIALIS TENDON TRANSFER / REPAIR     posterior     Past OB/GYN History: OB History  Gravida Para Term Preterm AB Living  0 0 0 0 0 0  SAB IAB Ectopic Multiple Live Births  0 0 0 0 0   S/p hysterectomy  She reports she had an abnormal mammogram last week and has a breat biopsy coming up.   Medications: She has a current medication list which includes the following prescription(s): aspirin, atorvastatin, vitamin d2, esomeprazole,  meclizine, multiple vitamin, saccharomyces boulardii, and zinc.   Allergies: Patient is allergic to penicillins and epinephrine.   Social History:  Social History   Tobacco Use   Smoking status: Former    Types: Cigarettes    Quit date: 1985    Years since quitting: 39.2   Smokeless tobacco: Never   Tobacco comments:    quit 30 yrs ago  Vaping Use   Vaping Use: Never used  Substance Use Topics   Alcohol use: Yes    Alcohol/week: 2.0 standard drinks of alcohol    Types: 2 Glasses of wine per week   Drug use: No    Relationship status: widowed She lives alone.   She is not employed. Regular exercise: No History of abuse: No  Family History:   Family History  Problem Relation Age of Onset   Cancer Mother    Lymphoma Mother    Colon cancer Neg Hx      Review of Systems: Review of Systems  Constitutional:  Negative for fever, malaise/fatigue and weight loss.  Respiratory:  Negative for cough, shortness of breath and wheezing.   Cardiovascular:  Positive for leg swelling. Negative for chest pain and palpitations.  Gastrointestinal:  Positive for blood in stool. Negative for abdominal pain.  Genitourinary:  Negative for dysuria.  Musculoskeletal:  Negative for myalgias.  Skin:  Negative for rash.  Neurological:  Negative for dizziness and headaches.  Endo/Heme/Allergies:  Bruises/bleeds easily.  Psychiatric/Behavioral:  Negative for depression. The patient is not nervous/anxious.      OBJECTIVE Physical Exam: Vitals:   10/31/22 1308 10/31/22 1325  BP: (!) 177/94 (!) 193/82  Pulse: 86 82  Weight: 170 lb (77.1 kg)   Height: 5' 4.45" (1.637 m)     Physical Exam Constitutional:      General: She is not in acute distress. Pulmonary:     Effort: Pulmonary effort is normal.  Abdominal:     General: There is no distension.     Palpations: Abdomen is soft.     Tenderness: There is no abdominal tenderness. There is no rebound.  Musculoskeletal:        General:  No swelling. Normal range of motion.  Skin:    General: Skin is warm and dry.     Findings: No rash.  Neurological:     Mental Status: She is alert and oriented to person, place, and time.  Psychiatric:        Mood and Affect: Mood normal.        Behavior: Behavior normal.      GU / Detailed Urogynecologic Evaluation:  Pelvic Exam: Normal external female genitalia; Bartholin's and Skene's glands normal in appearance; urethral meatus normal in appearance, no urethral masses or discharge.   CST: negative  s/p hysterectomy: Speculum exam reveals normal vaginal mucosa with  atrophy and normal vaginal cuff.  Adnexa normal  adnexa.    Pelvic floor strength I/V, puborectalis IV/V external anal sphincter IV/V  Pelvic floor musculature: Right levator non-tender, Right obturator non-tender, Left levator non-tender, Left obturator non-tender  POP-Q:   POP-Q  -1.5                                            Aa   -1.5                                           Ba  -4                                              C   4                                            Gh  4.5                                            Pb  6                                            tvl   2                                            Ap  2                                            Bp                                                 D      Rectal Exam:  Normal external rectum  Post-Void Residual (PVR) by Bladder Scan: In order to evaluate bladder emptying, we discussed obtaining a postvoid residual and she agreed to this procedure.  Procedure: The ultrasound unit was placed on the patient's abdomen in the suprapubic region after the patient had voided. A PVR of 80 ml was obtained by bladder scan.  Laboratory Results: POC urine: negative   ASSESSMENT AND PLAN Ms. Forcier is a 89 y.o. with:  1. Prolapse of posterior vaginal wall   2. Vaginal vault prolapse after hysterectomy   3. Prolapse of  anterior vaginal wall   4. Overactive bladder   5. Urinary frequency   6. SUI (stress urinary incontinence, female)    Stage I- II anterior, Stage III posterior, Stage I apical prolapse - For treatment of  pelvic organ prolapse, we discussed options for management including expectant management, conservative management, and surgical management, such as Kegels, a pessary, pelvic floor physical therapy, and specific surgical procedures. - She is considering a pessary or surgery. Would recommend colpocleisis with perineorrhaphy. Handouts provided for both options. She wants to wait on the outcome of her breast biopsy before she decides.   OAB - We discussed the symptoms of overactive bladder (OAB), which include urinary urgency, urinary frequency, nocturia, with or without urge incontinence.  While we do not know the exact etiology of OAB, several treatment options exist. We discussed management including behavioral therapy (decreasing bladder irritants, urge suppression strategies, timed voids, bladder retraining), physical therapy, medication; for refractory cases posterior tibial nerve stimulation, sacral neuromodulation, and intravesical botulinum toxin injection.  - She has tried multiple medications. She was previously interested in sacral neuromodulation. At this time she feels that although her leakage is bothersome, she is used to it and it is manageable.   3. SUI - For treatment of stress urinary incontinence,  non-surgical options include expectant management, weight loss, physical therapy, as well as a pessary.  Surgical options include a midurethral sling, Burch urethropexy, and transurethral injection of a bulking agent. - Had urodynamics about 5 years ago at [facility]. If she decides she wants surgery, would recommend repeating urodynamics  She will contact us with what she decides    Jaquita Folds, MD

## 2022-10-31 NOTE — Patient Instructions (Signed)
Constipation: Our goal is to achieve formed bowel movements daily or every-other-day.  You may need to try different combinations of the following options to find what works best for you - everybody's body works differently so feel free to adjust the dosages as needed.  Some options to help maintain bowel health include:  Dietary changes (more leafy greens, vegetables and fruits; less processed foods) Fiber supplementation (Benefiber, FiberCon, Metamucil or Psyllium). Start slow and increase gradually to full dose. Over-the-counter agents such as: stool softeners (Docusate or Colace) and/or laxatives (Miralax, milk of magnesia)  "Power Pudding" is a natural mixture that may help your constipation.  To make blend 1 cup applesauce, 1 cup wheat bran, and 3/4 cup prune juice, refrigerate and then take 1 tablespoon daily with a large glass of water as needed.  

## 2022-11-27 DIAGNOSIS — I10 Essential (primary) hypertension: Secondary | ICD-10-CM | POA: Insufficient documentation

## 2022-11-27 DIAGNOSIS — L97509 Non-pressure chronic ulcer of other part of unspecified foot with unspecified severity: Secondary | ICD-10-CM | POA: Insufficient documentation

## 2022-11-27 DIAGNOSIS — E785 Hyperlipidemia, unspecified: Secondary | ICD-10-CM | POA: Insufficient documentation

## 2023-01-09 DIAGNOSIS — C50811 Malignant neoplasm of overlapping sites of right female breast: Secondary | ICD-10-CM | POA: Insufficient documentation

## 2023-11-14 ENCOUNTER — Ambulatory Visit: Payer: Medicare Other | Admitting: Obstetrics and Gynecology

## 2023-11-14 VITALS — BP 166/81 | HR 79

## 2023-11-14 DIAGNOSIS — N3281 Overactive bladder: Secondary | ICD-10-CM | POA: Diagnosis not present

## 2023-11-14 DIAGNOSIS — N952 Postmenopausal atrophic vaginitis: Secondary | ICD-10-CM

## 2023-11-14 DIAGNOSIS — N393 Stress incontinence (female) (male): Secondary | ICD-10-CM

## 2023-11-14 DIAGNOSIS — N816 Rectocele: Secondary | ICD-10-CM

## 2023-11-14 MED ORDER — ESTRADIOL 0.1 MG/GM VA CREA: TOPICAL_CREAM | VAGINAL | 11 refills | Status: AC

## 2023-11-14 NOTE — Progress Notes (Unsigned)
 [facility] Return Visit  SUBJECTIVE  History of Present Illness: Katie Tran is a 89 y.o. female seen in follow-up for prolapse and mixed incontinence.  Sometimes when she stands, she has some discomfort in the groin area. She is concerned about her rectocele.   She still has incontinence but feels that she is used to it. She is thinking about getting a purewick. Has previously tried Myrbetriq, Furniture conservator/restorer for OAB.   Past Medical History: Patient  has a past medical history of Alpha galactosidase deficiency, COPD (chronic obstructive pulmonary disease) (HCC), GERD (gastroesophageal reflux disease), Hyperlipidemia, Hypertension, Rectocele, and Vertigo.   Past Surgical History: She  has a past surgical history that includes Dilation and curettage of uterus; Mohs surgery; Cataract extraction, bilateral; Fracture surgery; Tibialis tendon transfer / repair; and Laparoscopic total hysterectomy.   Medications: She has a current medication list which includes the following prescription(s): aspirin, atorvastatin, vitamin d2, esomeprazole, meclizine, multiple vitamin, saccharomyces boulardii, and zinc.   Allergies: Patient is allergic to penicillins and epinephrine.   Social History: Patient  reports that she quit smoking about 40 years ago. Her smoking use included cigarettes. She has never used smokeless tobacco. She reports current alcohol use of about 2.0 standard drinks of alcohol per week. She reports that she does not use drugs.     OBJECTIVE     Physical Exam: Vitals:   11/14/23 1415  BP: (!) 166/81  Pulse: 79   Gen: No apparent distress, A&O x 3.  Detailed Urogynecologic Evaluation:  Deferred. Prior exam showed:      No data to display             ASSESSMENT AND PLAN    Ms. Minnifield is a 89 y.o. with:  No diagnosis found.  There are no diagnoses linked to this encounter.   Marguerita Beards, MD

## 2023-11-14 NOTE — Patient Instructions (Signed)
 Start miralax daily to help prevent constipation.

## 2023-11-15 ENCOUNTER — Encounter: Payer: Self-pay | Admitting: Obstetrics and Gynecology

## 2023-11-29 ENCOUNTER — Ambulatory Visit (INDEPENDENT_AMBULATORY_CARE_PROVIDER_SITE_OTHER): Admitting: Obstetrics and Gynecology

## 2023-11-29 ENCOUNTER — Encounter: Payer: Self-pay | Admitting: Obstetrics and Gynecology

## 2023-11-29 ENCOUNTER — Other Ambulatory Visit (HOSPITAL_COMMUNITY)
Admission: RE | Admit: 2023-11-29 | Discharge: 2023-11-29 | Disposition: A | Discharge status: Home / Self Care | Source: Other Acute Inpatient Hospital | Attending: Obstetrics and Gynecology | Admitting: Obstetrics and Gynecology

## 2023-11-29 VITALS — BP 151/83 | HR 80

## 2023-11-29 DIAGNOSIS — N813 Complete uterovaginal prolapse: Secondary | ICD-10-CM | POA: Diagnosis not present

## 2023-11-29 DIAGNOSIS — R82998 Other abnormal findings in urine: Secondary | ICD-10-CM

## 2023-11-29 DIAGNOSIS — R319 Hematuria, unspecified: Secondary | ICD-10-CM

## 2023-11-29 DIAGNOSIS — R350 Frequency of micturition: Secondary | ICD-10-CM | POA: Diagnosis present

## 2023-11-29 LAB — URINALYSIS, ROUTINE W REFLEX MICROSCOPIC
Bilirubin Urine: NEGATIVE
Glucose, UA: NEGATIVE mg/dL
Hgb urine dipstick: NEGATIVE
Ketones, ur: 5 mg/dL — AB
Nitrite: POSITIVE — AB
Protein, ur: 100 mg/dL — AB
Specific Gravity, Urine: 1.027 (ref 1.005–1.030)
WBC, UA: >50 WBC/hpf (ref 0–5)
pH: 5 (ref 5.0–8.0)

## 2023-11-29 LAB — POCT URINALYSIS DIPSTICK
Glucose, UA: NEGATIVE
Ketones, UA: POSITIVE
Nitrite, UA: POSITIVE
Protein, UA: POSITIVE — AB
Spec Grav, UA: 1.025 (ref 1.010–1.025)
Urobilinogen, UA: 0.2 U/dL
pH, UA: 5.5 (ref 5.0–8.0)

## 2023-11-29 MED ORDER — SULFAMETHOXAZOLE-TRIMETHOPRIM 800-160 MG PO TABS: 1.0000 | ORAL_TABLET | Freq: Two times a day (BID) | ORAL | 0 refills | Status: DC

## 2023-11-29 NOTE — Progress Notes (Signed)
  Urogynecology   Subjective:     Chief Complaint:  Chief Complaint  Patient presents with   Pessary Check    Katie Tran is a 89 y.o. female is here for pessary cleaning.   History of Present Illness: Katie Tran is a 89 y.o. female with stage III pelvic organ prolapse and stress incontinence who presents for a pessary check. She is using a size #2 incontinence ring with support pessary. The pessary has been working well and she has no complaints. She is using vaginal estrogen. She denies vaginal bleeding.  She is taking miralax daily and reports this has significantly helped her bowel movements.   Past Medical History: Patient  has a past medical history of Alpha galactosidase deficiency, COPD (chronic obstructive pulmonary disease) (HCC), GERD (gastroesophageal reflux disease), Hyperlipidemia, Hypertension, Rectocele, and Vertigo.   Past Surgical History: She  has a past surgical history that includes Dilation and curettage of uterus; Mohs surgery; Cataract extraction, bilateral; Fracture surgery; Tibialis tendon transfer / repair; and Laparoscopic total hysterectomy.   Medications: She has a current medication list which includes the following prescription(s): aspirin, atorvastatin, vitamin d2, esomeprazole, estradiol, meclizine, multiple vitamin, saccharomyces boulardii, sulfamethoxazole-trimethoprim, and zinc.   Allergies: Patient is allergic to penicillins and epinephrine.   Social History: Patient  reports that she quit smoking about 40 years ago. Her smoking use included cigarettes. She has never used smokeless tobacco. She reports current alcohol use of about 2.0 standard drinks of alcohol per week. She reports that she does not use drugs.      Objective:    Physical Exam: BP (!) 151/83   Pulse 80  Gen: No apparent distress, A&O x 3. Detailed Urogynecologic Evaluation:  Pelvic Exam: Normal external female genitalia; Bartholin's and Skene's glands  normal in appearance; urethral meatus normal in appearance, no urethral masses or discharge. The pessary was noted to be in place. It was removed and cleaned. Speculum exam revealed no lesions in the vagina. The pessary was replaced. It was comfortable to the patient and fit well.   Laboratory Results: Lab Results  Component Value Date   COLORU yellow 11/29/2023   CLARITYU cloudy 11/29/2023   GLUCOSEUR Negative 11/29/2023   BILIRUBINUR Small 11/29/2023   KETONESU Positive 11/29/2023   SPECGRAV 1.025 11/29/2023   RBCUR Trace intact 11/29/2023   PHUR 5.5 11/29/2023   PROTEINUR Positive (A) 11/29/2023   UROBILINOGEN 0.2 11/29/2023   LEUKOCYTESUR Small (1+) (A) 11/29/2023     Assessment/Plan:    Assessment: Katie Tran is a 89 y.o. with stage III pelvic organ prolapse and stress incontinence here for a pessary check. She is doing well.  Plan: She will keep the pessary in place until next visit. She will continue to use estrogen. She will follow-up in 3 months for a pessary check or sooner as needed.   Patient did have signs of a UTI on testing today. Bactrim sent in for patient and will send urine for culture.   All questions were answered.

## 2023-12-01 LAB — URINE CULTURE: Culture: >=100000 — AB

## 2023-12-03 ENCOUNTER — Encounter: Payer: Self-pay | Admitting: Obstetrics and Gynecology

## 2023-12-03 ENCOUNTER — Other Ambulatory Visit: Payer: Self-pay | Admitting: Obstetrics and Gynecology

## 2023-12-03 MED ORDER — NITROFURANTOIN MONOHYD MACRO 100 MG PO CAPS: 100.0000 mg | ORAL_CAPSULE | Freq: Two times a day (BID) | ORAL | 0 refills | Status: AC

## 2023-12-03 MED ORDER — NITROFURANTOIN MONOHYD MACRO 100 MG PO CAPS: 100.0000 mg | ORAL_CAPSULE | Freq: Every day | ORAL | 5 refills | Status: AC

## 2023-12-03 NOTE — Progress Notes (Signed)
 Patient called stating she could not take the Bactrim  as she had an allergic reaction. We will switch her medication to Macrobid  as the culture is sensitive to the medication.

## 2024-01-15 ENCOUNTER — Encounter: Payer: Self-pay | Admitting: *Deleted

## 2024-02-28 ENCOUNTER — Encounter: Payer: Self-pay | Admitting: Obstetrics and Gynecology

## 2024-02-28 ENCOUNTER — Ambulatory Visit (INDEPENDENT_AMBULATORY_CARE_PROVIDER_SITE_OTHER): Admitting: Obstetrics and Gynecology

## 2024-02-28 VITALS — BP 166/80 | HR 73

## 2024-02-28 DIAGNOSIS — N816 Rectocele: Secondary | ICD-10-CM | POA: Diagnosis not present

## 2024-02-28 DIAGNOSIS — N390 Urinary tract infection, site not specified: Secondary | ICD-10-CM

## 2024-02-28 DIAGNOSIS — Z87891 Personal history of nicotine dependence: Secondary | ICD-10-CM | POA: Diagnosis not present

## 2024-02-28 DIAGNOSIS — N952 Postmenopausal atrophic vaginitis: Secondary | ICD-10-CM

## 2024-02-28 DIAGNOSIS — N3281 Overactive bladder: Secondary | ICD-10-CM

## 2024-02-28 DIAGNOSIS — Z960 Presence of urogenital implants: Secondary | ICD-10-CM

## 2024-02-28 NOTE — Progress Notes (Signed)
 [facility] Return Visit  SUBJECTIVE  History of Present Illness: Katie Tran is a 89 y.o. female seen in follow-up for rUTI, Posterior vaginal prolapse and OAB. Plan at last visit was use pessary for prolapse and see if this helps.     Past Medical History: Patient  has a past medical history of Alpha galactosidase deficiency, COPD (chronic obstructive pulmonary disease) (HCC), GERD (gastroesophageal reflux disease), Hyperlipidemia, Hypertension, Rectocele, and Vertigo.   Past Surgical History: She  has a past surgical history that includes Dilation and curettage of uterus; Mohs surgery; Cataract extraction, bilateral; Fracture surgery; Tibialis tendon transfer / repair; and Laparoscopic total hysterectomy.   Medications: She has a current medication list which includes the following prescription(s): nitrofurantoin  (macrocrystal-monohydrate), aspirin, atorvastatin, vitamin d2, esomeprazole, estradiol , meclizine, multiple vitamin, saccharomyces boulardii, and zinc.   Allergies: Patient is allergic to bactrim  [sulfamethoxazole -trimethoprim ], penicillins, and epinephrine.   Social History: Patient  reports that she quit smoking about 40 years ago. Her smoking use included cigarettes. She has never used smokeless tobacco. She reports current alcohol use of about 2.0 standard drinks of alcohol per week. She reports that she does not use drugs.     OBJECTIVE     Physical Exam: Vitals:   02/28/24 1426  BP: (!) 166/80  Pulse: 73   Gen: No apparent distress, A&O x 3.  Detailed Urogynecologic Evaluation:  Patient has not been using estrogen in the vagina. Obvious signs of vaginal irritation with mild bleeding. Pessary not to be replaced today. No obvious mass, discharge, or other concerning vaginal findings.    ASSESSMENT AND PLAN    Katie Tran is a 89 y.o. with:  1. Prolapse of posterior vaginal wall   2. Vaginal atrophy   3. Recurrent UTI   4. Overactive  bladder    Patient to let me know if she wants a pessary re-fit after she has used estrogen in the vagina weekly for 1 week and the twice a week thereafter.  Patient instructed to insert estrogen cream further into vagina for support of vaginal tissues.  Patient to take Macrobid  for suppression of rUTI. We also discussed I would like to be able to see the cultures so if she has symptoms to please call.  Patient has failed multiple medications and is unsure if she wants to try anything for her OAB at this point. She is too scared of retention to do botox and PTNS may be too much time for her. She is considering the PTNS out of everything and reports if she wants to pursue this she will call and let us  know.   Patient to follow up in 6 months or sooner if needed or desires to do PTNS.     Amr Sturtevant G Jasiyah Poland, NP

## 2024-02-28 NOTE — Patient Instructions (Addendum)
 Decrease Miralax use to every other day  Consider PTNS. You can still consider Botox if you want.   Do the Prophylaxis for UTI symptoms  Do th estrogen cream every night into the vagina for the next week and then twice a week.    Please call if you have UTI symptoms and need a culture.

## 2024-05-19 ENCOUNTER — Ambulatory Visit (INDEPENDENT_AMBULATORY_CARE_PROVIDER_SITE_OTHER): Admitting: Obstetrics

## 2024-05-19 ENCOUNTER — Encounter: Payer: Self-pay | Admitting: Obstetrics

## 2024-05-19 VITALS — BP 136/80 | HR 64

## 2024-05-19 DIAGNOSIS — N3281 Overactive bladder: Secondary | ICD-10-CM | POA: Diagnosis not present

## 2024-05-19 DIAGNOSIS — K4090 Unilateral inguinal hernia, without obstruction or gangrene, not specified as recurrent: Secondary | ICD-10-CM | POA: Diagnosis not present

## 2024-05-19 DIAGNOSIS — N816 Rectocele: Secondary | ICD-10-CM | POA: Diagnosis not present

## 2024-05-19 DIAGNOSIS — N952 Postmenopausal atrophic vaginitis: Secondary | ICD-10-CM | POA: Diagnosis not present

## 2024-05-19 DIAGNOSIS — N993 Prolapse of vaginal vault after hysterectomy: Secondary | ICD-10-CM | POA: Insufficient documentation

## 2024-05-19 DIAGNOSIS — N390 Urinary tract infection, site not specified: Secondary | ICD-10-CM | POA: Insufficient documentation

## 2024-05-19 DIAGNOSIS — Z48816 Encounter for surgical aftercare following surgery on the genitourinary system: Secondary | ICD-10-CM | POA: Diagnosis not present

## 2024-05-19 LAB — POCT URINALYSIS DIP (CLINITEK)
Blood, UA: NEGATIVE
Glucose, UA: NEGATIVE mg/dL
Leukocytes, UA: NEGATIVE
Nitrite, UA: NEGATIVE
POC PROTEIN,UA: 100 — AB
Spec Grav, UA: >=1.03 — AB (ref 1.010–1.025)
Urobilinogen, UA: 1 U/dL
pH, UA: 5.5 (ref 5.0–8.0)

## 2024-05-19 NOTE — Assessment & Plan Note (Addendum)
-   prior use of size #2 incontinence ring with support pessary removed due to lack of improvement of urinary leakage - replaced today per pt due to reduced discomfort since vaginal estrogen use - reassess bowel consistency with pessary use - we reviewed the importance of a better bowel regimen.  We also discussed the importance of avoiding chronic straining, as it can exacerbate her pelvic floor symptoms; we discussed treating constipation and straining prior to surgery, as postoperative straining can lead to damage to the repair and recurrence of symptoms. We discussed initiating therapy with increasing fluid intake, fiber supplementation, stool softeners, and laxatives such as miralax.  - encouraged increased frequency of miralax use to optimize stool consistnecy

## 2024-05-19 NOTE — Progress Notes (Unsigned)
 Katie Tran Return Visit  SUBJECTIVE  History of Present Illness: Katie Tran is a 89 y.o. female seen in follow-up for recurrent UTI, stage III pelvic organ prolapse, and overactive bladder. Plan at last visit was resume vaginal estrogen and return for pessary fitting, macrobid  for suppression.   Reports lower abdominal pain attributed to LLQ inguinal hernia, denies pain when she is sitting Reports LLQ Pain started 5 days ago on Thursday after skipping miralax for a few days, usually uses 1 capful of miralax every 2 days. Diarrhea with daily miralax use.  LLQ reduced to 5/10 intermittently today after diarrhea, denies nausea or vomiting, denies hard lump in groin Reports burning with urination for 1 day with reduced urinary frequency. Denies hematuria, sensation of incomplete emptying, change in back pain, fevers.  Stopped vaginal estrogen use for 1 week, previously used 2x/week [employer], sanctura and vesicare in the past. Consider PTNS however unsure if she wants to proceed due to frequency of treatment and desires to discuss at follow-up visit in 06/2024 Prior use of size #2 incontinence dish pessary removed due to lack of improvement of urinary leakage.   Past Medical History: Patient  has a past medical history of Alpha galactosidase deficiency, COPD (chronic obstructive pulmonary disease) (HCC), GERD (gastroesophageal reflux disease), Hyperlipidemia, Hypertension, Rectocele, and Vertigo.   Past Surgical History: She  has a past surgical history that includes Dilation and curettage of uterus; Mohs surgery; Cataract extraction, bilateral; Fracture surgery; Tibialis tendon transfer / repair; and Laparoscopic total hysterectomy.   Medications: She has a current medication list which includes the following prescription(s): aspirin, atorvastatin, calcium carbonate, vitamin d2, esomeprazole, estradiol , magnesium, meclizine, multiple vitamin, nitrofurantoin   (macrocrystal-monohydrate), saccharomyces boulardii, and zinc.   Allergies: Patient is allergic to alpha-gal, bactrim  [sulfamethoxazole -trimethoprim ], penicillins, sulfamethoxazole , beef (diagnostic), and epinephrine.   Social History: Patient  reports that she quit smoking about 40 years ago. Her smoking use included cigarettes. She has never used smokeless tobacco. She reports current alcohol use of about 2.0 standard drinks of alcohol per week. She reports that she does not use drugs.     OBJECTIVE     Physical Exam: Vitals:   05/20/24 0817  BP: 136/80  Pulse: 64    Physical Exam Constitutional:      General: She is not in acute distress.    Appearance: Normal appearance.  Genitourinary:     Bladder and urethral meatus normal.     No lesions in the vagina.     Right Labia: No rash, tenderness, lesions or skin changes.    Left Labia: No tenderness, lesions, skin changes or rash.    No vaginal discharge, erythema, tenderness, bleeding, ulceration or granulation tissue.     Posterior vaginal prolapse present.    Mild vaginal atrophy present.    Cervix is absent.     Uterus is absent.     Urethral meatus caruncle not present.    No urethral prolapse, tenderness, mass or stress urinary incontinence with cough stress test present.     Bladder is not tender.   Pulmonary:     Effort: Pulmonary effort is normal. No respiratory distress.  Abdominal:     General: Abdomen is flat. There is no distension.     Palpations: Abdomen is soft. There is no mass.     Tenderness: There is abdominal tenderness.     Hernia: A hernia is present.  Neurological:     Mental Status: She is alert.  Vitals reviewed. Exam conducted with  a chaperone present.    Lab Results  Component Value Date   COLORU yellow 05/19/2024   CLARITYU clear 05/19/2024   GLUCOSEUR negative 05/19/2024   BILIRUBINUR moderate (A) 05/19/2024   KETONESU Positive 11/29/2023   SPECGRAV >=1.030 (A) 05/19/2024   RBCUR  negative 05/19/2024   PHUR 5.5 05/19/2024   PROTEINUR 100 (A) 11/29/2023   UROBILINOGEN 1.0 05/19/2024   LEUKOCYTESUR Negative 05/19/2024     Post Void Residual - 05/19/24 1554       Post Void Residual   Post Void Residual 5 mL         A size 2 incontinence dish pessary was placed. It was comfortable, stayed in place with valsalva and was an appropriate size on examination, with one finger fitting between the pessary and the vaginal walls.        ASSESSMENT AND PLAN    [name] is a 89 y.o. with:  1. Recurrent UTI   2. Inguinal hernia of left side without obstruction or gangrene   3. Vaginal atrophy   4. Prolapse of posterior vaginal wall   5. Overactive bladder     Recurrent UTI Assessment & Plan: - dysuria for 1 day after diarrhea  - POCT UA + protein and bilirubin - encouraged to resume vaginal estrogen - discussed proper vulvar care, warm compression, avoid pad use, cotton only underwear and barrier ointment if needed  - encouraged Vit E suppository, moisturizer with Replens/Revaree   Orders: -     POCT URINALYSIS DIP (CLINITEK)  Inguinal hernia of left side without obstruction or gangrene Assessment & Plan: - 5/10 L inguinal pain with reducible hernia noted, most bothersome symptom - denies severe pain, N/V - ordered abdominal ultrasound and proceed with general surgery referral if positive finding on imaging or persistent symptoms - advised pt to seek care urgently if she experiences severe pain, N/V or inability to reduce bulge  Orders: -     US  Abdomen Complete; Future  Vaginal atrophy Assessment & Plan: - For symptomatic vaginal atrophy options include lubrication with a water-based lubricant, personal hygiene measures and barrier protection against wetness, and estrogen replacement in the form of vaginal cream, vaginal tablets, or a time-released vaginal ring.   - encouraged to resume vaginal estrogen cream for history of rUTI and prior discomfort  with pessary management   Prolapse of posterior vaginal wall Assessment & Plan: - prior use of size #2 incontinence ring with support pessary removed due to lack of improvement of urinary leakage - replaced today per pt due to reduced discomfort since vaginal estrogen use - reassess bowel consistency with pessary use - we reviewed the importance of a better bowel regimen.  We also discussed the importance of avoiding chronic straining, as it can exacerbate her pelvic floor symptoms; we discussed treating constipation and straining prior to surgery, as postoperative straining can lead to damage to the repair and recurrence of symptoms. We discussed initiating therapy with increasing fluid intake, fiber supplementation, stool softeners, and laxatives such as miralax.  - encouraged increased frequency of miralax use to optimize stool consistnecy    Overactive bladder Assessment & Plan: - tried Myrbetriq, sanctura and vesicare in the past - For refractory OAB we reviewed the procedure for intravesical Botox injection with cystoscopy in the office and reviewed the risks, benefits and alternatives of treatment including but not limited to infection, need for self-catheterization and need for repeat therapy.  We discussed that there is a 5-15% chance of needing to  catheterize with Botox and that this usually resolves in a few months; however can persist for longer periods of time.  Typically Botox injections would need to be repeated every 3-12 months since this is not a permanent therapy.   We discussed the role of sacral neuromodulation and how it works. It requires a test phase, and documentation of bladder function via diary. After a successful test period, a permanent wire and generator are placed in the OR. The battery lasts 5 years on average and would need to be replaced surgically.  The goal of this therapy is at least a 50% improvement in symptoms. It is NOT realistic to expect a 100% cure.  We  reviewed the fact that about 30% of patients fail the test phase and are not candidates for permanent generator placement.  We discussed the risk of infection and that the patient would have to switch to MRI compatible mode during imaging once the device is placed. There are two companies that provide this therapy: [employer] and [employer] that both offer rechargeable or non-rechargeable options. For all procedures, we discussed risks of bleeding, infection, damage to surrounding organs including bowel, bladder, blood vessels, ureters and nerves, need for further surgery, risk of postoperative urinary incontinence or retention with need to catheterize, recurrent prolapse, numbness and weakness at any body site, buttock pain, and the rarer risks of blood clot, heart attack, pneumonia, death.    We also discussed the role of percutaneous tibial nerve stimulation and how it works.  She understands it requires 12 weekly visits for temporary neuromodulation of the sacral nerve roots via the tibial nerve and that she may then require continued tapered treatment.  She will return for the procedure. All questions were answered. - Consider PTNS however unsure if she wants to proceed due to frequency of treatment and desires to discuss at follow-up visit in 06/2024   Time spent: I spent 39 minutes dedicated to the care of this patient on the date of this encounter to include pre-visit review of records, face-to-face time with the patient discussing Left inguinal hernia, dysuria, pelvic organ prolapse, vaginal atrophy, overactive bladder, and post visit documentation and ordering medication/ testing.    Lianne ONEIDA Gillis, MD

## 2024-05-19 NOTE — Assessment & Plan Note (Signed)
-   tried Myrbetriq, sanctura and vesicare in the past - For refractory OAB we reviewed the procedure for intravesical Botox injection with cystoscopy in the office and reviewed the risks, benefits and alternatives of treatment including but not limited to infection, need for self-catheterization and need for repeat therapy.  We discussed that there is a 5-15% chance of needing to catheterize with Botox and that this usually resolves in a few months; however can persist for longer periods of time.  Typically Botox injections would need to be repeated every 3-12 months since this is not a permanent therapy.   We discussed the role of sacral neuromodulation and how it works. It requires a test phase, and documentation of bladder function via diary. After a successful test period, a permanent wire and generator are placed in the OR. The battery lasts 5 years on average and would need to be replaced surgically.  The goal of this therapy is at least a 50% improvement in symptoms. It is NOT realistic to expect a 100% cure.  We reviewed the fact that about 30% of patients fail the test phase and are not candidates for permanent generator placement.  We discussed the risk of infection and that the patient would have to switch to MRI compatible mode during imaging once the device is placed. There are two companies that provide this therapy: [employer] and [employer] that both offer rechargeable or non-rechargeable options. For all procedures, we discussed risks of bleeding, infection, damage to surrounding organs including bowel, bladder, blood vessels, ureters and nerves, need for further surgery, risk of postoperative urinary incontinence or retention with need to catheterize, recurrent prolapse, numbness and weakness at any body site, buttock pain, and the rarer risks of blood clot, heart attack, pneumonia, death.    We also discussed the role of percutaneous tibial nerve stimulation and how it works.  She understands it  requires 12 weekly visits for temporary neuromodulation of the sacral nerve roots via the tibial nerve and that she may then require continued tapered treatment.  She will return for the procedure. All questions were answered. - Consider PTNS however unsure if she wants to proceed due to frequency of treatment and desires to discuss at follow-up visit in 06/2024

## 2024-05-19 NOTE — Assessment & Plan Note (Signed)
-   dysuria for 1 day after diarrhea  - POCT UA + protein and bilirubin - encouraged to resume vaginal estrogen - discussed proper vulvar care, warm compression, avoid pad use, cotton only underwear and barrier ointment if needed  - encouraged Vit E suppository, moisturizer with Replens/Revaree

## 2024-05-19 NOTE — Assessment & Plan Note (Addendum)
-   5/10 L inguinal pain with reducible hernia noted, most bothersome symptom - denies severe pain, N/V - ordered abdominal ultrasound and proceed with general surgery referral if positive finding on imaging or persistent symptoms - advised pt to seek care urgently if she experiences severe pain, N/V or inability to reduce bulge

## 2024-05-19 NOTE — Assessment & Plan Note (Deleted)
-   prior use of size #2 incontinence ring with support pessary removed due to lack of improvement of urinary leakage - replaced today due to reduced discomfort since vaginal estrogen use - reassess bowel consistency with pessary use - we reviewed the importance of a better bowel regimen.  We also discussed the importance of avoiding chronic straining, as it can exacerbate her pelvic floor symptoms; we discussed treating constipation and straining prior to surgery, as postoperative straining can lead to damage to the repair and recurrence of symptoms. We discussed initiating therapy with increasing fluid intake, fiber supplementation, stool softeners, and laxatives such as miralax.  - encouraged increased frequency of miralax use to optimize stool consistnecy

## 2024-05-19 NOTE — Assessment & Plan Note (Signed)
-   For symptomatic vaginal atrophy options include lubrication with a water-based lubricant, personal hygiene measures and barrier protection against wetness, and estrogen replacement in the form of vaginal cream, vaginal tablets, or a time-released vaginal ring.   - encouraged to resume vaginal estrogen cream for history of rUTI and prior discomfort with pessary management

## 2024-05-19 NOTE — Patient Instructions (Addendum)
 Continue vaginal estrogen 1g twice a week.   Continue to increase fiber supplementation daily to optimize stool consistency.   We have replaced your #2 incontinence dish pessary  - discussed proper vulvar care, warm compression, avoid pad use, cotton only underwear and barrier ointment if needed  - encouraged Vit E suppository, moisturizer with Replens/Revaree  Please seek evaluation at the emergency if you experience severe abdominal pain, nausea/vomiting, or inability to reduce your inguinal bulge.   Please call radiology at [phone] to schedule your imaging study today

## 2024-05-22 NOTE — Progress Notes (Unsigned)
 [facility] Return Visit  SUBJECTIVE  History of Present Illness: Katie Tran is a 89 y.o. female seen in follow-up for recurrent UTI, stage III pelvic organ prolapse, and overactive bladder. Plan at last visit was replacement of size 2 incontinence dish pessary 05/19/24.   Reports lower abdominal pain attributed to LLQ inguinal hernia, denies pain when she is sitting Reports LLQ Pain started 05/15/24 after skipping miralax for a few days, usually uses 1 capful of miralax every 2 days. Diarrhea with daily miralax use.  LLQ reduced to 5/10 intermittently 05/19/24 after diarrhea, denies nausea or vomiting, denies hard lump in groin Reports burning with urination for 1 day with reduced urinary frequency. Denies hematuria, sensation of incomplete emptying, change in back pain, fevers.  Stopped vaginal estrogen use for 1 week, previously used 2x/week [employer], sanctura and vesicare in the past. Consider PTNS however unsure if she wants to proceed due to frequency of treatment and desires to discuss at follow-up visit in 06/2024 Prior use of size #2 incontinence dish pessary removed due to lack of improvement of urinary leakage. Replaced 05/19/24  Past Medical History: Patient  has a past medical history of Alpha galactosidase deficiency, COPD (chronic obstructive pulmonary disease) (HCC), GERD (gastroesophageal reflux disease), Hyperlipidemia, Hypertension, Rectocele, and Vertigo.   Past Surgical History: She  has a past surgical history that includes Dilation and curettage of uterus; Mohs surgery; Cataract extraction, bilateral; Fracture surgery; Tibialis tendon transfer / repair; and Laparoscopic total hysterectomy.   Medications: She has a current medication list which includes the following prescription(s): aspirin, atorvastatin, calcium carbonate, vitamin d2, esomeprazole, estradiol , magnesium, meclizine, multiple vitamin, nitrofurantoin  (macrocrystal-monohydrate),  saccharomyces boulardii, and zinc.   Allergies: Patient is allergic to alpha-gal, bactrim  [sulfamethoxazole -trimethoprim ], penicillins, sulfamethoxazole , beef (diagnostic), and epinephrine.   Social History: Patient  reports that she quit smoking about 40 years ago. Her smoking use included cigarettes. She has never used smokeless tobacco. She reports current alcohol use of about 2.0 standard drinks of alcohol per week. She reports that she does not use drugs.     OBJECTIVE     Physical Exam: There were no vitals filed for this visit. Gen: No apparent distress, A&O x 3.  Detailed Urogynecologic Evaluation:  Deferred. Prior exam showed:      No data to display             ASSESSMENT AND PLAN    [name] is a 89 y.o. with:  No diagnosis found.  There are no diagnoses linked to this encounter.   Lianne ONEIDA Gillis, MD

## 2024-05-23 ENCOUNTER — Ambulatory Visit (INDEPENDENT_AMBULATORY_CARE_PROVIDER_SITE_OTHER): Admitting: Obstetrics

## 2024-05-23 ENCOUNTER — Encounter: Payer: Self-pay | Admitting: Obstetrics

## 2024-05-23 VITALS — BP 192/79 | HR 76

## 2024-05-23 DIAGNOSIS — K4090 Unilateral inguinal hernia, without obstruction or gangrene, not specified as recurrent: Secondary | ICD-10-CM

## 2024-05-23 DIAGNOSIS — N816 Rectocele: Secondary | ICD-10-CM

## 2024-05-23 DIAGNOSIS — Z960 Presence of urogenital implants: Secondary | ICD-10-CM

## 2024-05-23 NOTE — Patient Instructions (Signed)
 We have removed your pessary today.   If you continue to experience persistent or worsening abdominal pain, or develop fever, nausea/vomiting, please seek immediate evaluation at the emergency room.   Please call if you are unable to void or pass bowel movements. You can splint (gently push the vaginal wall back into the vagina) as needed for comfort or void/defecation.

## 2024-05-23 NOTE — Assessment & Plan Note (Signed)
-   5/10 L inguinal pain with reducible hernia noted, most bothersome symptom resolved with pessary placement. However desires removal due to generalized abdominal discomfort and change in bowel movement. Size 2 incontinence dish removed today - denies severe pain, N/V - ordered abdominal ultrasound and proceed with general surgery referral if positive finding on imaging or persistent symptoms - advised pt to seek care urgently if she experiences severe pain, N/V or inability to reduce bulge

## 2024-05-23 NOTE — Assessment & Plan Note (Signed)
-   prior use of size #2 incontinence ring with support pessary removed due to lack of improvement of urinary leakage - replaced on 05/19/24 due to reduced discomfort since vaginal estrogen use - worsened bowel consistency with pessary use per patient due to sensation of incomplete emptying - we reviewed the importance of a better bowel regimen.  We also discussed the importance of avoiding chronic straining, as it can exacerbate her pelvic floor symptoms; we discussed treating constipation and straining prior to surgery, as postoperative straining can lead to damage to the repair and recurrence of symptoms. We discussed initiating therapy with increasing fluid intake, fiber supplementation, stool softeners, and laxatives such as miralax.  - encouraged increased frequency of miralax use to optimize stool consistnecy

## 2024-05-29 ENCOUNTER — Ambulatory Visit (HOSPITAL_COMMUNITY)
Admission: RE | Admit: 2024-05-29 | Discharge: 2024-05-29 | Disposition: A | Discharge status: Home / Self Care | Source: Ambulatory Visit | Attending: Obstetrics | Admitting: Obstetrics

## 2024-05-29 ENCOUNTER — Other Ambulatory Visit: Payer: Self-pay | Admitting: Obstetrics

## 2024-05-29 DIAGNOSIS — N3281 Overactive bladder: Secondary | ICD-10-CM

## 2024-05-29 DIAGNOSIS — K4090 Unilateral inguinal hernia, without obstruction or gangrene, not specified as recurrent: Secondary | ICD-10-CM | POA: Diagnosis present

## 2024-05-29 DIAGNOSIS — N390 Urinary tract infection, site not specified: Secondary | ICD-10-CM

## 2024-05-29 DIAGNOSIS — N952 Postmenopausal atrophic vaginitis: Secondary | ICD-10-CM

## 2024-05-29 DIAGNOSIS — N816 Rectocele: Secondary | ICD-10-CM

## 2024-06-10 ENCOUNTER — Encounter: Payer: Self-pay | Admitting: Internal Medicine

## 2024-06-11 ENCOUNTER — Ambulatory Visit: Payer: Self-pay | Admitting: Obstetrics and Gynecology

## 2024-06-11 DIAGNOSIS — K4090 Unilateral inguinal hernia, without obstruction or gangrene, not specified as recurrent: Secondary | ICD-10-CM

## 2024-06-20 ENCOUNTER — Other Ambulatory Visit: Payer: Self-pay | Admitting: Surgery

## 2024-06-20 ENCOUNTER — Ambulatory Visit: Payer: Self-pay | Admitting: Surgery

## 2024-06-20 DIAGNOSIS — K439 Ventral hernia without obstruction or gangrene: Secondary | ICD-10-CM | POA: Insufficient documentation

## 2024-06-20 DIAGNOSIS — R1011 Right upper quadrant pain: Secondary | ICD-10-CM | POA: Insufficient documentation

## 2024-06-23 ENCOUNTER — Ambulatory Visit (HOSPITAL_COMMUNITY)
Admission: RE | Admit: 2024-06-23 | Discharge: 2024-06-23 | Disposition: A | Discharge status: Home / Self Care | Source: Ambulatory Visit | Attending: Surgery | Admitting: Surgery

## 2024-06-23 DIAGNOSIS — K439 Ventral hernia without obstruction or gangrene: Secondary | ICD-10-CM | POA: Insufficient documentation

## 2024-06-23 MED ORDER — IOHEXOL 350 MG/ML SOLN
75.0000 mL | Freq: Once | INTRAVENOUS | Status: AC | PRN
  Administered 2024-06-23: 75 mL via INTRAVENOUS

## 2024-06-25 ENCOUNTER — Ambulatory Visit: Payer: Self-pay | Admitting: Surgery

## 2024-06-25 NOTE — Progress Notes (Signed)
 Please call the patient and let them know that their scan showed the left inguinal hernia.  She also has two small hernias near her umbilicus.  Please order a RUQ ultrasound to examine her gallbladder for RUQ pain.\  Thanks

## 2024-06-27 ENCOUNTER — Other Ambulatory Visit (HOSPITAL_COMMUNITY): Payer: Self-pay | Admitting: Surgery

## 2024-06-27 DIAGNOSIS — R1011 Right upper quadrant pain: Secondary | ICD-10-CM

## 2024-07-01 ENCOUNTER — Ambulatory Visit (HOSPITAL_COMMUNITY)
Admission: RE | Admit: 2024-07-01 | Discharge: 2024-07-01 | Disposition: A | Discharge status: Home / Self Care | Source: Ambulatory Visit | Attending: Surgery | Admitting: Surgery

## 2024-07-01 DIAGNOSIS — R1011 Right upper quadrant pain: Secondary | ICD-10-CM | POA: Insufficient documentation

## 2024-07-02 ENCOUNTER — Ambulatory Visit (INDEPENDENT_AMBULATORY_CARE_PROVIDER_SITE_OTHER): Admitting: Obstetrics and Gynecology

## 2024-07-02 ENCOUNTER — Ambulatory Visit: Payer: Self-pay | Admitting: Surgery

## 2024-07-02 ENCOUNTER — Encounter: Payer: Self-pay | Admitting: Obstetrics and Gynecology

## 2024-07-02 VITALS — BP 168/80 | HR 76 | Ht 64.5 in | Wt 164.0 lb

## 2024-07-02 DIAGNOSIS — N3281 Overactive bladder: Secondary | ICD-10-CM

## 2024-07-02 DIAGNOSIS — R251 Tremor, unspecified: Secondary | ICD-10-CM | POA: Insufficient documentation

## 2024-07-02 DIAGNOSIS — K649 Unspecified hemorrhoids: Secondary | ICD-10-CM | POA: Insufficient documentation

## 2024-07-02 DIAGNOSIS — N816 Rectocele: Secondary | ICD-10-CM

## 2024-07-02 DIAGNOSIS — N393 Stress incontinence (female) (male): Secondary | ICD-10-CM

## 2024-07-02 DIAGNOSIS — Z960 Presence of urogenital implants: Secondary | ICD-10-CM

## 2024-07-02 DIAGNOSIS — N813 Complete uterovaginal prolapse: Secondary | ICD-10-CM | POA: Diagnosis not present

## 2024-07-02 DIAGNOSIS — Z91018 Allergy to other foods: Secondary | ICD-10-CM | POA: Insufficient documentation

## 2024-07-02 NOTE — Progress Notes (Signed)
 I need to see her back in the office to discuss the surgical plan

## 2024-07-02 NOTE — Progress Notes (Signed)
 [facility]   Subjective:     Chief Complaint: Pessary Check  History of Present Illness: Katie Tran is a 89 y.o. female with stage III pelvic organ prolapse and OAB who presents today for a pessary fitting.    Past Medical History: Patient  has a past medical history of Alpha galactosidase deficiency, COPD (chronic obstructive pulmonary disease) (HCC), GERD (gastroesophageal reflux disease), Hyperlipidemia, Hypertension, Rectocele, and Vertigo.   Past Surgical History: She  has a past surgical history that includes Dilation and curettage of uterus; Mohs surgery; Cataract extraction, bilateral; Fracture surgery; Tibialis tendon transfer / repair; and Laparoscopic total hysterectomy.   Medications: She has a current medication list which includes the following prescription(s): aspirin, atorvastatin, calcium carbonate, vitamin d2, esomeprazole, estradiol , magnesium, meclizine, multiple vitamin, nitrofurantoin  (macrocrystal-monohydrate), saccharomyces boulardii, and zinc.   Allergies: Patient is allergic to alpha-gal, bactrim  [sulfamethoxazole -trimethoprim ], penicillins, sulfamethoxazole , beef (diagnostic), and epinephrine.   Social History: Patient  reports that she quit smoking about 40 years ago. Her smoking use included cigarettes. She has never used smokeless tobacco. She reports current alcohol use of about 2.0 standard drinks of alcohol per week. She reports that she does not use drugs.      Objective:    BP (!) 168/80   Pulse 76   Ht 5' 4.5 (1.638 m)   Wt 164 lb (74.4 kg)   BMI 27.72 kg/m  Gen: No apparent distress, A&O x 3. Pelvic Exam: Normal external female genitalia; Bartholin's and Skene's glands normal in appearance; urethral meatus normal in appearance, no urethral masses or discharge.   A size #1 cube pessary was fitted. It was comfortable, stayed in place with valsalva and was an appropriate size on examination, with one finger fitting  between the pessary and the vaginal walls. Patient was able to void and valsalva without pessary expulsion.    Assessment/Plan:    Assessment: Katie Tran is a 89 y.o. with stage III pelvic organ prolapse and OAB who presents for a pessary fitting. Plan: She was fitted with a #1 cube pessary. She will keep the pessary in place until next visit. She will use estrogen.   Follow-up in 6 weeks for a pessary check or sooner as needed.  All questions were answered.    Shenita Trego G Saksham Akkerman, NP

## 2024-07-14 ENCOUNTER — Ambulatory Visit: Payer: Self-pay | Admitting: Surgery

## 2024-08-08 ENCOUNTER — Ambulatory Visit: Admitting: Internal Medicine

## 2024-08-18 ENCOUNTER — Ambulatory Visit: Admitting: Obstetrics and Gynecology
